# Patient Record
Sex: Male | Born: 1975 | Race: White | Hispanic: No | Marital: Single | Smoking: Current every day smoker
Health system: Southern US, Community
[De-identification: ages and names within clinical notes are randomized; demographics above are authoritative.]

## PROBLEM LIST (undated history)

## (undated) DIAGNOSIS — B370 Candidal stomatitis: Secondary | ICD-10-CM

## (undated) DIAGNOSIS — J189 Pneumonia, unspecified organism: Secondary | ICD-10-CM

## (undated) DIAGNOSIS — Z21 Asymptomatic human immunodeficiency virus [HIV] infection status: Secondary | ICD-10-CM

## (undated) DIAGNOSIS — B029 Zoster without complications: Secondary | ICD-10-CM

## (undated) DIAGNOSIS — B977 Papillomavirus as the cause of diseases classified elsewhere: Secondary | ICD-10-CM

## (undated) DIAGNOSIS — B20 Human immunodeficiency virus [HIV] disease: Secondary | ICD-10-CM

## (undated) HISTORY — DX: Candidal stomatitis: B37.0

## (undated) HISTORY — DX: Papillomavirus as the cause of diseases classified elsewhere: B97.7

## (undated) HISTORY — DX: Zoster without complications: B02.9

## (undated) HISTORY — PX: OTHER SURGICAL HISTORY: SHX169

## (undated) HISTORY — PX: NO PAST SURGERIES: SHX2092

---

## 2004-11-25 DIAGNOSIS — B20 Human immunodeficiency virus [HIV] disease: Secondary | ICD-10-CM

## 2005-07-16 ENCOUNTER — Ambulatory Visit: Payer: Self-pay | Admitting: Infectious Diseases

## 2005-07-16 ENCOUNTER — Ambulatory Visit (HOSPITAL_COMMUNITY): Admission: RE | Admit: 2005-07-16 | Discharge: 2005-07-16 | Payer: Self-pay | Admitting: Infectious Diseases

## 2005-08-06 ENCOUNTER — Ambulatory Visit: Payer: Self-pay | Admitting: Infectious Diseases

## 2005-12-09 ENCOUNTER — Encounter (INDEPENDENT_AMBULATORY_CARE_PROVIDER_SITE_OTHER): Payer: Self-pay | Admitting: *Deleted

## 2005-12-09 ENCOUNTER — Ambulatory Visit: Payer: Self-pay | Admitting: Infectious Diseases

## 2006-02-03 ENCOUNTER — Ambulatory Visit: Payer: Self-pay | Admitting: Infectious Diseases

## 2006-02-09 ENCOUNTER — Ambulatory Visit: Payer: Self-pay | Admitting: Infectious Diseases

## 2006-03-24 ENCOUNTER — Ambulatory Visit: Payer: Self-pay | Admitting: Infectious Diseases

## 2006-04-05 ENCOUNTER — Ambulatory Visit: Payer: Self-pay | Admitting: Infectious Diseases

## 2006-04-08 ENCOUNTER — Ambulatory Visit: Payer: Self-pay | Admitting: Internal Medicine

## 2006-04-08 ENCOUNTER — Encounter: Payer: Self-pay | Admitting: Infectious Diseases

## 2006-04-18 ENCOUNTER — Ambulatory Visit: Payer: Self-pay | Admitting: Infectious Diseases

## 2006-05-19 ENCOUNTER — Ambulatory Visit: Payer: Self-pay | Admitting: Infectious Diseases

## 2006-05-19 ENCOUNTER — Encounter (INDEPENDENT_AMBULATORY_CARE_PROVIDER_SITE_OTHER): Payer: Self-pay | Admitting: *Deleted

## 2006-05-19 LAB — CONVERTED CEMR LAB
CD4 Count: 560 uL
HIV 1 RNA Quant: 171 {copies}/mL

## 2006-06-16 ENCOUNTER — Encounter: Payer: Self-pay | Admitting: Infectious Diseases

## 2006-06-16 ENCOUNTER — Ambulatory Visit: Payer: Self-pay | Admitting: Infectious Diseases

## 2006-08-11 ENCOUNTER — Encounter (INDEPENDENT_AMBULATORY_CARE_PROVIDER_SITE_OTHER): Payer: Self-pay | Admitting: *Deleted

## 2006-08-11 ENCOUNTER — Ambulatory Visit: Payer: Self-pay | Admitting: Infectious Diseases

## 2006-08-11 LAB — CONVERTED CEMR LAB
ALT: 28 U/L
AST: 19 U/L
Albumin: 4.3 g/dL
Alkaline Phosphatase: 73 U/L
Bilirubin, Direct: 0.1 mg/dL
Indirect Bilirubin: 0.2 mg/dL
Total Bilirubin: 0.3 mg/dL
Total Protein: 6.7 g/dL

## 2006-10-05 ENCOUNTER — Ambulatory Visit: Payer: Self-pay | Admitting: Infectious Diseases

## 2006-10-05 ENCOUNTER — Encounter (INDEPENDENT_AMBULATORY_CARE_PROVIDER_SITE_OTHER): Payer: Self-pay | Admitting: *Deleted

## 2006-10-05 LAB — CONVERTED CEMR LAB
ALT: 18 units/L (ref 0–53)
AST: 14 units/L (ref 0–37)
Albumin: 4.9 g/dL (ref 3.5–5.2)
Alkaline Phosphatase: 62 units/L (ref 39–117)
Bilirubin Urine: NEGATIVE
Calcium: 9.1 mg/dL (ref 8.4–10.5)
Creatinine, Ser: 0.91 mg/dL (ref 0.40–1.50)
HDL: 36 mg/dL — ABNORMAL LOW (ref >39)
HIV 1 RNA Quant: 49 copies/mL
Hemoglobin, Urine: NEGATIVE
Ketones, ur: NEGATIVE mg/dL
Protein, ur: NEGATIVE mg/dL
Total Protein: 6.6 g/dL (ref 6.0–8.3)
Triglycerides: 87 mg/dL (ref <150)
Urobilinogen, UA: 0.2 (ref 0.0–1.0)

## 2006-12-19 ENCOUNTER — Encounter (INDEPENDENT_AMBULATORY_CARE_PROVIDER_SITE_OTHER): Payer: Self-pay | Admitting: *Deleted

## 2006-12-19 LAB — CONVERTED CEMR LAB

## 2006-12-26 ENCOUNTER — Ambulatory Visit: Payer: Self-pay | Admitting: Infectious Diseases

## 2006-12-26 DIAGNOSIS — Z8619 Personal history of other infectious and parasitic diseases: Secondary | ICD-10-CM

## 2006-12-26 DIAGNOSIS — B977 Papillomavirus as the cause of diseases classified elsewhere: Secondary | ICD-10-CM | POA: Insufficient documentation

## 2006-12-26 LAB — CONVERTED CEMR LAB
ALT: 23 units/L (ref 0–53)
HIV 1 RNA Quant: 49 copies/mL
Indirect Bilirubin: 0.5 mg/dL (ref 0.0–0.9)
Total Protein: 7.1 g/dL (ref 6.0–8.3)

## 2007-01-01 ENCOUNTER — Encounter (INDEPENDENT_AMBULATORY_CARE_PROVIDER_SITE_OTHER): Payer: Self-pay | Admitting: *Deleted

## 2007-01-24 ENCOUNTER — Ambulatory Visit: Payer: Self-pay | Admitting: Infectious Diseases

## 2007-01-24 DIAGNOSIS — F17200 Nicotine dependence, unspecified, uncomplicated: Secondary | ICD-10-CM | POA: Insufficient documentation

## 2007-03-23 ENCOUNTER — Encounter (INDEPENDENT_AMBULATORY_CARE_PROVIDER_SITE_OTHER): Payer: Self-pay | Admitting: *Deleted

## 2007-03-23 ENCOUNTER — Ambulatory Visit: Payer: Self-pay | Admitting: Infectious Diseases

## 2007-03-23 LAB — CONVERTED CEMR LAB
Bilirubin, Direct: 0.1 mg/dL (ref 0.0–0.3)
CD4 Count: 1126 microliters
CO2: 28 meq/L (ref 19–32)
Calcium: 9.6 mg/dL (ref 8.4–10.5)
Glucose, Bld: 78 mg/dL (ref 70–99)
LDL Cholesterol: 85 mg/dL (ref 0–99)
Leukocytes, UA: NEGATIVE
Nitrite: NEGATIVE
Phosphorus: 4.2 mg/dL (ref 2.3–4.6)
Potassium: 4.2 meq/L (ref 3.5–5.3)
Sodium: 142 meq/L (ref 135–145)
Specific Gravity, Urine: 1.022 (ref 1.005–1.03)
Total Bilirubin: 0.3 mg/dL (ref 0.3–1.2)
Total CHOL/HDL Ratio: 3.7
VLDL: 29 mg/dL (ref 0–40)
pH: 6 (ref 5.0–8.0)

## 2007-06-06 ENCOUNTER — Ambulatory Visit: Payer: Self-pay | Admitting: Infectious Diseases

## 2007-06-06 LAB — CONVERTED CEMR LAB
Albumin: 4.8 g/dL (ref 3.5–5.2)
Total Bilirubin: 0.7 mg/dL (ref 0.3–1.2)
Total Protein: 7.4 g/dL (ref 6.0–8.3)

## 2007-06-08 ENCOUNTER — Encounter: Payer: Self-pay | Admitting: Infectious Diseases

## 2007-06-08 LAB — CONVERTED CEMR LAB: HIV 1 RNA Quant: 49 copies/mL

## 2007-08-28 ENCOUNTER — Ambulatory Visit: Payer: Self-pay | Admitting: Hospitalist

## 2007-08-28 DIAGNOSIS — R05 Cough: Secondary | ICD-10-CM

## 2007-08-28 DIAGNOSIS — D409 Neoplasm of uncertain behavior of male genital organ, unspecified: Secondary | ICD-10-CM

## 2007-08-30 ENCOUNTER — Ambulatory Visit: Payer: Self-pay | Admitting: Infectious Diseases

## 2007-08-30 LAB — CONVERTED CEMR LAB
ALT: 33 units/L (ref 0–53)
AST: 26 units/L (ref 0–37)
BUN: 17 mg/dL (ref 6–23)
Bilirubin Urine: NEGATIVE
CD4 Count: 1121 microliters
Calcium: 9.6 mg/dL (ref 8.4–10.5)
Cholesterol: 144 mg/dL (ref 0–200)
Hemoglobin, Urine: NEGATIVE
Indirect Bilirubin: 0.3 mg/dL (ref 0.0–0.9)
Ketones, ur: NEGATIVE mg/dL
Nitrite: NEGATIVE
Potassium: 4.5 meq/L (ref 3.5–5.3)
Protein, ur: NEGATIVE mg/dL
Sodium: 141 meq/L (ref 135–145)
Total CHOL/HDL Ratio: 4.5
Total Protein: 7.3 g/dL (ref 6.0–8.3)
Triglycerides: 175 mg/dL — ABNORMAL HIGH (ref <150)
Urine Glucose: NEGATIVE mg/dL
Urobilinogen, UA: 1 (ref 0.0–1.0)
VLDL: 35 mg/dL (ref 0–40)
pH: 7 (ref 5.0–8.0)

## 2007-09-26 ENCOUNTER — Ambulatory Visit: Payer: Self-pay | Admitting: Hospitalist

## 2007-09-26 DIAGNOSIS — R21 Rash and other nonspecific skin eruption: Secondary | ICD-10-CM | POA: Insufficient documentation

## 2007-09-26 LAB — CONVERTED CEMR LAB
Basophils Absolute: 0 10*3/uL (ref 0.0–0.1)
CMV IgM: <0.9 (ref <0.90)
Chlamydia, Swab/Urine, PCR: NEGATIVE
GC Probe Amp, Urine: NEGATIVE
Lymphocytes Relative: 44 % (ref 12–46)
Neutro Abs: 1.9 10*3/uL (ref 1.7–7.7)
Neutrophils Relative %: 44 % (ref 43–77)
Platelets: 189 10*3/uL (ref 150–400)
RDW: 13.1 % (ref 11.5–15.5)
Sed Rate: 2 mm/hr (ref 0–16)

## 2007-10-09 ENCOUNTER — Ambulatory Visit: Payer: Self-pay | Admitting: Infectious Diseases

## 2007-10-24 ENCOUNTER — Encounter: Payer: Self-pay | Admitting: Infectious Diseases

## 2007-10-24 ENCOUNTER — Encounter (INDEPENDENT_AMBULATORY_CARE_PROVIDER_SITE_OTHER): Payer: Self-pay | Admitting: *Deleted

## 2007-12-01 ENCOUNTER — Ambulatory Visit: Payer: Self-pay | Admitting: Infectious Diseases

## 2007-12-01 LAB — CONVERTED CEMR LAB
ALT: 18 units/L (ref 0–53)
Alkaline Phosphatase: 86 units/L (ref 39–117)
Indirect Bilirubin: 0.2 mg/dL (ref 0.0–0.9)
Total Protein: 6.9 g/dL (ref 6.0–8.3)

## 2008-02-22 ENCOUNTER — Ambulatory Visit: Payer: Self-pay | Admitting: Infectious Diseases

## 2008-02-22 LAB — CONVERTED CEMR LAB
ALT: 17 units/L (ref 0–53)
AST: 15 units/L (ref 0–37)
Bilirubin, Direct: 0.1 mg/dL (ref 0.0–0.3)
Calcium: 9 mg/dL (ref 8.4–10.5)
Cholesterol: 123 mg/dL (ref 0–200)
Glucose, Bld: 97 mg/dL (ref 70–99)
Hemoglobin, Urine: NEGATIVE
Ketones, ur: NEGATIVE mg/dL
Nitrite: NEGATIVE
Phosphorus: 4 mg/dL (ref 2.3–4.6)
Potassium: 4.5 meq/L (ref 3.5–5.3)
Protein, ur: NEGATIVE mg/dL
Sodium: 141 meq/L (ref 135–145)
Total CHOL/HDL Ratio: 3.4
Total Protein: 7.3 g/dL (ref 6.0–8.3)
Triglycerides: 62 mg/dL (ref <150)
Urine Glucose: NEGATIVE mg/dL
VLDL: 12 mg/dL (ref 0–40)
pH: 6 (ref 5.0–8.0)

## 2008-05-06 ENCOUNTER — Encounter: Payer: Self-pay | Admitting: Infectious Diseases

## 2008-05-14 ENCOUNTER — Ambulatory Visit: Payer: Self-pay | Admitting: Infectious Diseases

## 2008-05-14 LAB — CONVERTED CEMR LAB
BUN: 16 mg/dL (ref 6–23)
Bilirubin, Direct: 0.1 mg/dL (ref 0.0–0.3)
CD4 Count: 1206 microliters
CO2: 24 meq/L (ref 19–32)
Glucose, Bld: 92 mg/dL (ref 70–99)
HIV 1 RNA Quant: 49 copies/mL
Indirect Bilirubin: 0.2 mg/dL (ref 0.0–0.9)
Leukocytes, UA: NEGATIVE
Nitrite: NEGATIVE
Phosphorus: 4.3 mg/dL (ref 2.3–4.6)
Potassium: 4.3 meq/L (ref 3.5–5.3)
Sodium: 139 meq/L (ref 135–145)
Specific Gravity, Urine: 1.024 (ref 1.005–1.03)
Total Bilirubin: 0.3 mg/dL (ref 0.3–1.2)
Total CHOL/HDL Ratio: 4.2
Urobilinogen, UA: 0.2 (ref 0.0–1.0)
VLDL: 35 mg/dL (ref 0–40)

## 2008-05-30 ENCOUNTER — Encounter (INDEPENDENT_AMBULATORY_CARE_PROVIDER_SITE_OTHER): Payer: Self-pay | Admitting: *Deleted

## 2008-06-05 ENCOUNTER — Ambulatory Visit: Payer: Self-pay | Admitting: Internal Medicine

## 2008-06-14 ENCOUNTER — Encounter (INDEPENDENT_AMBULATORY_CARE_PROVIDER_SITE_OTHER): Payer: Self-pay | Admitting: *Deleted

## 2008-08-19 ENCOUNTER — Encounter: Payer: Self-pay | Admitting: Internal Medicine

## 2008-08-19 ENCOUNTER — Ambulatory Visit: Payer: Self-pay | Admitting: Infectious Diseases

## 2008-08-19 LAB — CONVERTED CEMR LAB
ALT: 64 units/L — ABNORMAL HIGH (ref 0–53)
Alkaline Phosphatase: 105 units/L (ref 39–117)
Basophils Absolute: 0 10*3/uL (ref 0.0–0.1)
CO2: 24 meq/L (ref 19–32)
Eosinophils Relative: 1 % (ref 0–5)
HCT: 46.2 % (ref 39.0–52.0)
HIV 1 RNA Quant: 188 copies/mL — ABNORMAL HIGH (ref <50)
Lymphocytes Relative: 27 % (ref 12–46)
Neutro Abs: 3.7 10*3/uL (ref 1.7–7.7)
Neutrophils Relative %: 61 % (ref 43–77)
Platelets: 194 10*3/uL (ref 150–400)
RDW: 13.3 % (ref 11.5–15.5)
Sodium: 142 meq/L (ref 135–145)
Total Bilirubin: 0.4 mg/dL (ref 0.3–1.2)
Total Protein: 7 g/dL (ref 6.0–8.3)

## 2008-09-05 ENCOUNTER — Ambulatory Visit: Payer: Self-pay | Admitting: Infectious Diseases

## 2009-01-15 ENCOUNTER — Encounter (INDEPENDENT_AMBULATORY_CARE_PROVIDER_SITE_OTHER): Payer: Self-pay | Admitting: *Deleted

## 2009-01-27 ENCOUNTER — Ambulatory Visit: Payer: Self-pay | Admitting: Infectious Diseases

## 2009-01-27 LAB — CONVERTED CEMR LAB
BUN: 11 mg/dL (ref 6–23)
CO2: 27 meq/L (ref 19–32)
Calcium: 9.2 mg/dL (ref 8.4–10.5)
Chloride: 105 meq/L (ref 96–112)
Creatinine, Ser: 0.92 mg/dL (ref 0.40–1.50)
GFR calc Af Amer: >60 mL/min (ref >60)
HIV 1 RNA Quant: <48 copies/mL (ref <48)
Lymphs Abs: 2.4 10*3/uL (ref 0.7–4.0)
Monocytes Relative: 9 % (ref 3–12)
Neutro Abs: 3.8 10*3/uL (ref 1.7–7.7)
Neutrophils Relative %: 55 % (ref 43–77)
RBC: 5.02 M/uL (ref 4.22–5.81)
WBC: 6.8 10*3/uL (ref 4.0–10.5)

## 2009-02-13 ENCOUNTER — Ambulatory Visit: Payer: Self-pay | Admitting: Infectious Diseases

## 2009-06-12 ENCOUNTER — Ambulatory Visit: Payer: Self-pay | Admitting: Infectious Diseases

## 2009-06-12 LAB — CONVERTED CEMR LAB
ALT: 29 units/L (ref 0–53)
AST: 19 units/L (ref 0–37)
CO2: 23 meq/L (ref 19–32)
Creatinine, Ser: 1 mg/dL (ref 0.40–1.50)
Eosinophils Absolute: 0.1 10*3/uL (ref 0.0–0.7)
Eosinophils Relative: 1 % (ref 0–5)
HCT: 45.7 % (ref 39.0–52.0)
HDL: 33 mg/dL — ABNORMAL LOW (ref >39)
HIV 1 RNA Quant: <48 copies/mL (ref <48)
HIV-1 RNA Quant, Log: <1.68 (ref <1.68)
Hemoglobin: 15.7 g/dL (ref 13.0–17.0)
LDL Cholesterol: 78 mg/dL (ref 0–99)
Lymphs Abs: 2.7 10*3/uL (ref 0.7–4.0)
MCV: 92.9 fL (ref >=78.0)
Monocytes Absolute: 0.6 10*3/uL (ref 0.1–1.0)
Monocytes Relative: 10 % (ref 3–12)
Platelets: 184 10*3/uL (ref 150–400)
RPR Ser Ql: REACTIVE — AB
Total Bilirubin: 0.4 mg/dL (ref 0.3–1.2)
Total CHOL/HDL Ratio: 4.3
VLDL: 30 mg/dL (ref 0–40)
WBC: 6.4 10*3/uL (ref 4.0–10.5)

## 2009-06-13 ENCOUNTER — Ambulatory Visit: Payer: Self-pay | Admitting: Infectious Diseases

## 2009-06-13 ENCOUNTER — Encounter (INDEPENDENT_AMBULATORY_CARE_PROVIDER_SITE_OTHER): Payer: Self-pay | Admitting: *Deleted

## 2010-01-16 ENCOUNTER — Telehealth (INDEPENDENT_AMBULATORY_CARE_PROVIDER_SITE_OTHER): Payer: Self-pay | Admitting: *Deleted

## 2010-03-24 ENCOUNTER — Encounter (INDEPENDENT_AMBULATORY_CARE_PROVIDER_SITE_OTHER): Payer: Self-pay | Admitting: *Deleted

## 2010-04-06 ENCOUNTER — Telehealth (INDEPENDENT_AMBULATORY_CARE_PROVIDER_SITE_OTHER): Payer: Self-pay | Admitting: *Deleted

## 2010-04-15 ENCOUNTER — Ambulatory Visit: Payer: Self-pay | Admitting: Infectious Diseases

## 2010-04-15 LAB — CONVERTED CEMR LAB
ALT: 28 units/L (ref 0–53)
AST: 18 units/L (ref 0–37)
BUN: 12 mg/dL (ref 6–23)
Basophils Relative: 0 % (ref 0–1)
Calcium: 9.1 mg/dL (ref 8.4–10.5)
Creatinine, Ser: 1.04 mg/dL (ref 0.40–1.50)
Eosinophils Absolute: 0.1 10*3/uL (ref 0.0–0.7)
Eosinophils Relative: 1 % (ref 0–5)
HCT: 46.6 % (ref 39.0–52.0)
HIV 1 RNA Quant: <48 copies/mL (ref <48)
MCHC: 34.1 g/dL (ref 30.0–36.0)
MCV: 95.1 fL (ref 78.0–100.0)
Neutrophils Relative %: 56 % (ref 43–77)
Platelets: 166 10*3/uL (ref 150–400)
RDW: 13.4 % (ref 11.5–15.5)
Total Bilirubin: 0.3 mg/dL (ref 0.3–1.2)

## 2010-09-16 ENCOUNTER — Encounter (INDEPENDENT_AMBULATORY_CARE_PROVIDER_SITE_OTHER): Payer: Self-pay | Admitting: *Deleted

## 2010-09-21 ENCOUNTER — Encounter: Payer: Self-pay | Admitting: Infectious Diseases

## 2010-10-21 ENCOUNTER — Ambulatory Visit: Payer: Self-pay | Admitting: Infectious Diseases

## 2010-11-05 ENCOUNTER — Ambulatory Visit: Admit: 2010-11-05 | Payer: Self-pay | Admitting: Infectious Diseases

## 2010-11-05 ENCOUNTER — Encounter (INDEPENDENT_AMBULATORY_CARE_PROVIDER_SITE_OTHER): Payer: Self-pay | Admitting: *Deleted

## 2010-11-24 NOTE — Miscellaneous (Addendum)
Summary: labs  Clinical Lists Changes  Orders: Added new Test order of T-CBC w/Diff [phone]) - Signed Added new Test order of T-CD4SP [facility]) (CD4SP) - Signed Added new Test order of T-Comprehensive Metabolic Panel [phone]) - Signed Added new Test order of T-Hepatitis B Surface Antibody [phone]) - Signed Added new Test order of T-HIV Viral Load [phone]) - Signed Added new Test order of T-RPR (Syphilis) [phone]) - Signed Added new Test order of T-Lipid Profile ([id number]) - Signed       Process Orders Check Orders Results:     [facility]: ABN not required for this insurance Order queued for requisitioning for [facility]: September 21, 2010 2:00 PM  Tests Sent for requisitioning (September 21, 2010 2:00 PM):     10/21/2010: [facility] -- T-CBC w/Diff [[id number]] (signed)     10/21/2010: [facility] -- T-Comprehensive Metabolic Panel [[id number]] (signed)     10/21/2010: [facility] -- T-Hepatitis B Surface Antibody [[id number]] (signed)     10/21/2010: [facility] -- T-HIV Viral Load [phone] (signed)     10/21/2010: [facility] -- T-RPR (Syphilis) [phone] (signed)     10/21/2010: [facility] -- T-Lipid Profile [phone] (signed)

## 2010-11-24 NOTE — Progress Notes (Signed)
Summary: New script/ patient approved for NCADAP  Phone Note Call from Patient   Caller: Patient Reason for Call: Refill Medication Summary of Call: Patient left message on voicemail that he has been approved for NCADAP and needs his medication called [facility] pharmacy. Please contact patient once this has been done. Initial call taken by: Paulo Fruit  BS,CPht II,MPH,  April 06, 2010 10:48 AM    Prescriptions: ATRIPLA 600-200-300 MG  TABS (EFAVIRENZ-EMTRICITAB-TENOFOVIR) Take 1 tablet by mouth at bedtime  #30 x 11   Entered by:   Paulo Fruit  BS,CPht II,MPH   Authorized by:   Lina Sayre MD   Signed by:   Paulo Fruit  BS,CPht II,MPH on 04/06/2010   Method used:   Electronically to        [facility] [id number]* (retail)       [address]       [place], [place]  [address]       Ph: [phone]       Fax:    RxID:   [id number]  Paulo Fruit  BS,CPht II,MPH  April 06, 2010 10:49 AM

## 2010-11-24 NOTE — Progress Notes (Signed)
Summary: Patient calling to renew for aDAP  Phone Note Call from Patient   Caller: Patient Details for Reason: to renew for ADAP Summary of Call: Patient called to see when he can come and renew for ADAP.  He was told that the next available to will be be in April once the new clinic opens.  He was very concerned about the deadline in which is 01/22/2010.  I was unable to accomadate him at this moment but reassured him that he needs to call the first week in April to get that taken care of. Initial call taken by: Paulo Fruit  BS,CPht II,MPH,  January 16, 2010 3:51 PM     Appended Document: Patient calling to renew for aDAP Patient came in today to complete ADAP reenrollment application

## 2010-11-24 NOTE — Miscellaneous (Signed)
Summary: RW FINANCIAL UPDATE   Clinical Lists Changes

## 2010-11-24 NOTE — Miscellaneous (Signed)
Summary: clinical update/ryan white NCADAP apprv til 01/23/11  Clinical Lists Changes  Observations: Added new observation of AIDSDAP: Yes 2011 (03/24/2010 14:48)

## 2010-11-24 NOTE — Miscellaneous (Signed)
Summary: RW FINANCIAL UPDATE  Clinical Lists Changes  Observations: Added new observation of PCTFPL: 94.62  (09/16/2010 16:23) Added new observation of HOUSEINCOME: 96295  (09/16/2010 16:23) Added new observation of FINASSESSDT: 08/14/2010  (09/16/2010 16:23)

## 2010-11-26 NOTE — Assessment & Plan Note (Signed)
Summary: updated RW form

## 2010-12-17 ENCOUNTER — Other Ambulatory Visit (INDEPENDENT_AMBULATORY_CARE_PROVIDER_SITE_OTHER): Payer: Self-pay

## 2010-12-17 ENCOUNTER — Other Ambulatory Visit: Payer: Self-pay | Admitting: Infectious Diseases

## 2010-12-17 ENCOUNTER — Encounter: Payer: Self-pay | Admitting: Infectious Diseases

## 2010-12-17 DIAGNOSIS — B20 Human immunodeficiency virus [HIV] disease: Secondary | ICD-10-CM

## 2010-12-17 LAB — CONVERTED CEMR LAB
HIV 1 RNA Quant: <20 copies/mL (ref <20)
HIV-1 RNA Quant, Log: <1.3 (ref <1.30)

## 2010-12-18 LAB — T-HELPER CELL (CD4) - (RCID CLINIC ONLY)
CD4 % Helper T Cell: 38 % (ref 33–55)
CD4 T Cell Abs: 910 uL (ref 400–2700)

## 2010-12-23 LAB — CONVERTED CEMR LAB
ALT: 26 units/L (ref 0–53)
AST: 18 units/L (ref 0–37)
Basophils Absolute: 0 10*3/uL (ref 0.0–0.1)
Basophils Relative: 0 % (ref 0–1)
CO2: 24 meq/L (ref 19–32)
Chloride: 106 meq/L (ref 96–112)
Cholesterol: 147 mg/dL (ref 0–200)
Creatinine, Ser: 0.97 mg/dL (ref 0.40–1.50)
Eosinophils Absolute: 0 10*3/uL (ref 0.0–0.7)
Eosinophils Relative: 0 % (ref 0–5)
HCT: 46.4 % (ref 39.0–52.0)
MCV: 93.9 fL (ref 78.0–100.0)
Neutrophils Relative %: 51 % (ref 43–77)
Platelets: 167 10*3/uL (ref 150–400)
RDW: 12.9 % (ref 11.5–15.5)
RPR Ser Ql: REACTIVE — AB
RPR Titer: 1:4 {titer}
Sodium: 139 meq/L (ref 135–145)
T pallidum Antibodies (TP-PA): >8 — ABNORMAL HIGH (ref <0.90)
Total Bilirubin: 0.5 mg/dL (ref 0.3–1.2)
Total Protein: 6.6 g/dL (ref 6.0–8.3)
Triglycerides: 98 mg/dL (ref <150)
VLDL: 20 mg/dL (ref 0–40)
WBC: 6.1 10*3/uL (ref 4.0–10.5)

## 2010-12-31 ENCOUNTER — Encounter: Payer: Self-pay | Admitting: Infectious Diseases

## 2010-12-31 ENCOUNTER — Ambulatory Visit (INDEPENDENT_AMBULATORY_CARE_PROVIDER_SITE_OTHER): Payer: Self-pay | Admitting: Infectious Diseases

## 2010-12-31 DIAGNOSIS — B20 Human immunodeficiency virus [HIV] disease: Secondary | ICD-10-CM

## 2010-12-31 DIAGNOSIS — Z23 Encounter for immunization: Secondary | ICD-10-CM

## 2011-01-05 ENCOUNTER — Encounter (INDEPENDENT_AMBULATORY_CARE_PROVIDER_SITE_OTHER): Payer: Self-pay | Admitting: *Deleted

## 2011-01-05 NOTE — Miscellaneous (Signed)
  Clinical Lists Changes  Orders: Added new Test order of T- * Misc. Laboratory test [phone]) - Signed Added new Test order of T-Hepatitis B Surface Antigen [id number]) - Signed     Process Orders Check Orders Results:     [facility]: ABN not required for this insurance Tests Sent for requisitioning (December 31, 2010 11:11 AM):     12/31/2010: [facility] -- T- * Misc. Laboratory test [[id number]] (signed)     12/31/2010: [facility] -- T-Hepatitis B Surface Antigen [[id number]] (signed)

## 2011-01-05 NOTE — Assessment & Plan Note (Signed)
Summary: [facility] FROM 1/12 F/U   Vital Signs:  Patient profile:   35 year old male Height:      70 inches (177.80 cm) Weight:      156.8 pounds (71.27 kg) BMI:     22.58 Temp:     98.2 degrees F (36.78 degrees C) oral Pulse rate:   90 / minute BP sitting:   115 / 72  (right arm)  Vitals Entered By: Wendall Mola CMA [facility] Dull) (December 31, 2010 10:30 AM) CC: follow-up visit, lab results Is Patient Diabetic? No Pain Assessment Patient in pain? no      Nutritional Status BMI of 19 -24 = normal Nutritional Status Detail appetite "normal"  Have you ever been in a relationship where you felt threatened, hurt or afraid?No   Does patient need assistance? Functional Status Self care Ambulation Normal Comments pt. missed a couple doses of Atripla since last visit   CC:  follow-up visit and lab results.  History of Present Illness: Rickey Jones is doing welll w/o complaint. 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 We will see if he remains HBV infected as well and get quant HBV DNA by PCR. He will continue Atripla and has had excellent reaponse with undectable HIV and CD4 940. Will f/u in 5-6 months and continuing Atripla..  Preventive Screening-Counseling & Management  Alcohol-Tobacco     Alcohol drinks/day: occasional     Alcohol type: beer     Smoking Status: current     Smoking Cessation Counseling: yes     Smoke Cessation Stage: contemplative     Packs/Day: 1.0     Year Started: AT THE AGE OF 21  Caffeine-Diet-Exercise     Caffeine use/day: coffee and soda     Does Patient Exercise: no  Hep-HIV-STD-Contraception     HIV Risk: no risk noted     HIV Risk Counseling: 07/27/2005  Safety-Violence-Falls     Seat Belt Use: yes      Sexual History:  currently monogamous.        Drug Use:  never.    Comments: pt. declined condoms  Allergies: 1)  ! Cephalexin  Physical Exam  General:   Well-developed,well-nourished,in no acute distress; alert,appropriate and cooperative throughout examination Mouth:  Oral mucosa and oropharynx without lesions or exudates.  Teeth in good repair. Cervical Nodes:  No lymphadenopathy noted Axillary Nodes:  No palpable lymphadenopathy Inguinal Nodes:  No significant adenopathy   Impression & Recommendations:  Problem # 1:  HIV DISEASE (ICD-042)  doing well and will see again in 5-6 months  Orders: Est. Patient Level IV (16109)  Problem # 2:  HEPATITIS B, HX OF (ICD-V12.09) Will check quant DNA HBV.  Other Orders: Est. Patient age 56-64 [phone])   Orders Added: 1)  Est. Patient age 70-64 [12] 2)  Est. Patient Level IV [phone]   Not Administered:    Influenza Vaccine not given due to: declined      Appended Document: [facility] FROM 1/12 F/U    Clinical Lists Changes  Orders: Added new Service order of Pneumococcal Vaccine (91478) - Signed Added new Service order of Admin 1st Vaccine (29562) - Signed Observations: Added new observation of PNEUMOVAXVIS: 09/29/09 version given December 31, 2010. (12/31/2010 11:26) Added new observation of PNEUMOVAXLOT: 1723AA (12/31/2010 11:26) Added new observation of PNEUMOVAXEXP: 05/20/2012 (12/31/2010 11:26) Added new observation of PNEUMOVAXBY: Wendall Mola CMA ( AAMA) (  12/31/2010 11:26) Added new observation of PNEUMOVAXRTE: IM (12/31/2010 11:26) Added new observation of PNEUMOVAXDOS: 0.5 ml (12/31/2010 11:26) Added new observation of PNEUMOVAXMFR: Merck (12/31/2010 11:26) Added new observation of PNEUMOVAXSIT: right deltoid (12/31/2010 11:26) Added new observation of PNEUMOVAX: Pneumovax (12/31/2010 11:26)       Immunizations Administered:  Pneumonia Vaccine:    Vaccine Type: Pneumovax    Site: right deltoid    Mfr: Merck    Dose: 0.5 ml    Route: IM    Given by: Wendall Mola CMA ( AAMA)    Exp. Date: 05/20/2012    Lot #: [id number]    VIS given: 09/29/09  version given December 31, 2010.

## 2011-01-05 NOTE — Miscellaneous (Signed)
  Clinical Lists Changes  Orders: Added new Test order of T-CBC w/Diff [phone]) - Signed Added new Test order of T-CD4SP [facility]) (CD4SP) - Signed Added new Test order of T-Comprehensive Metabolic Panel [phone]) - Signed Added new Test order of T-HIV Viral Load [id number]) - Signed     Process Orders Check Orders Results:     [facility]: ABN not required for this insurance Tests Sent for requisitioning (December 31, 2010 11:14 AM):     06/02/2011: [facility] -- T-CBC w/Diff [[id number]] (signed)     06/02/2011: [facility] -- T-Comprehensive Metabolic Panel [[id number]] (signed)     06/02/2011: [facility] -- T-HIV Viral Load [phone] (signed)

## 2011-01-10 LAB — T-HELPER CELL (CD4) - (RCID CLINIC ONLY)
CD4 % Helper T Cell: 37 % (ref 33–55)
CD4 T Cell Abs: 850 uL (ref 400–2700)

## 2011-01-12 NOTE — Miscellaneous (Signed)
Summary: adap update   Clinical Lists Changes  Observations: Added new observation of AIDSDAP: Pending-approval 2012 (01/05/2011 17:48) Added new observation of PCTFPL: 81.14  (01/05/2011 17:48) Added new observation of HOUSEINCOME: 8788  (01/05/2011 17:48) Added new observation of FINASSESSDT: 01/05/2011  (01/05/2011 17:48)

## 2011-01-14 ENCOUNTER — Encounter (INDEPENDENT_AMBULATORY_CARE_PROVIDER_SITE_OTHER): Payer: Self-pay | Admitting: *Deleted

## 2011-01-21 NOTE — Miscellaneous (Signed)
  Clinical Lists Changes  Observations: Added new observation of AIDSDAP: Yes 2012 (01/14/2011 18:12)

## 2011-01-30 LAB — T-HELPER CELL (CD4) - (RCID CLINIC ONLY)
CD4 % Helper T Cell: 36 % (ref 33–55)
CD4 T Cell Abs: 920 uL (ref 400–2700)

## 2011-04-14 ENCOUNTER — Other Ambulatory Visit: Payer: Self-pay | Admitting: *Deleted

## 2011-04-14 DIAGNOSIS — B20 Human immunodeficiency virus [HIV] disease: Secondary | ICD-10-CM

## 2011-04-14 MED ORDER — EFAVIRENZ-EMTRICITAB-TENOFOVIR 600-200-300 MG PO TABS: 1.0000 | ORAL_TABLET | Freq: Every day | ORAL | Status: DC

## 2011-06-30 ENCOUNTER — Ambulatory Visit: Payer: Self-pay

## 2011-07-26 LAB — T-HELPER CELL (CD4) - (RCID CLINIC ONLY): CD4 T Cell Abs: 620

## 2011-09-23 ENCOUNTER — Encounter: Payer: Self-pay | Admitting: *Deleted

## 2011-10-21 ENCOUNTER — Telehealth: Payer: Self-pay | Admitting: *Deleted

## 2011-10-21 NOTE — Telephone Encounter (Signed)
Left message for patient to call and schedule a follow up appointment

## 2011-12-02 ENCOUNTER — Telehealth: Payer: Self-pay | Admitting: Infectious Diseases

## 2011-12-03 ENCOUNTER — Other Ambulatory Visit: Payer: Self-pay | Admitting: *Deleted

## 2011-12-03 DIAGNOSIS — B20 Human immunodeficiency virus [HIV] disease: Secondary | ICD-10-CM

## 2011-12-03 MED ORDER — EFAVIRENZ-EMTRICITAB-TENOFOVIR 600-200-300 MG PO TABS: 1.0000 | ORAL_TABLET | Freq: Every day | ORAL | Status: DC

## 2012-01-12 ENCOUNTER — Telehealth: Payer: Self-pay

## 2012-01-12 NOTE — Telephone Encounter (Signed)
[facility] called trying to reach patient about his atripla shipment - patient not returning calls - called him as well to remind him to renew adap and left message to call me back as well.

## 2012-01-13 ENCOUNTER — Other Ambulatory Visit: Payer: Self-pay | Admitting: *Deleted

## 2012-01-13 ENCOUNTER — Other Ambulatory Visit (INDEPENDENT_AMBULATORY_CARE_PROVIDER_SITE_OTHER): Payer: Self-pay

## 2012-01-13 DIAGNOSIS — Z79899 Other long term (current) drug therapy: Secondary | ICD-10-CM

## 2012-01-13 DIAGNOSIS — Z113 Encounter for screening for infections with a predominantly sexual mode of transmission: Secondary | ICD-10-CM

## 2012-01-13 DIAGNOSIS — B20 Human immunodeficiency virus [HIV] disease: Secondary | ICD-10-CM

## 2012-01-13 LAB — CBC WITH DIFFERENTIAL/PLATELET
Eosinophils Relative: 1 % (ref 0–5)
HCT: 46.2 % (ref 39.0–52.0)
Hemoglobin: 15.9 g/dL (ref 13.0–17.0)
Lymphocytes Relative: 44 % (ref 12–46)
Lymphs Abs: 2.4 10*3/uL (ref 0.7–4.0)
MCV: 93 fL (ref 78.0–100.0)
Monocytes Absolute: 0.5 10*3/uL (ref 0.1–1.0)
Monocytes Relative: 9 % (ref 3–12)
Neutro Abs: 2.5 10*3/uL (ref 1.7–7.7)
RBC: 4.97 MIL/uL (ref 4.22–5.81)
RDW: 13.1 % (ref 11.5–15.5)
WBC: 5.4 10*3/uL (ref 4.0–10.5)

## 2012-01-13 LAB — COMPLETE METABOLIC PANEL WITH GFR
AST: 20 U/L (ref 0–37)
Albumin: 4.5 g/dL (ref 3.5–5.2)
BUN: 8 mg/dL (ref 6–23)
CO2: 26 mEq/L (ref 19–32)
Calcium: 8.6 mg/dL (ref 8.4–10.5)
Chloride: 105 mEq/L (ref 96–112)
Creat: 0.9 mg/dL (ref 0.50–1.35)
GFR, Est African American: >89 mL/min
Glucose, Bld: 91 mg/dL (ref 70–99)
Potassium: 4.3 mEq/L (ref 3.5–5.3)

## 2012-01-13 LAB — RPR TITER: RPR Titer: 1:4 {titer}

## 2012-01-13 LAB — URINALYSIS, ROUTINE W REFLEX MICROSCOPIC
Glucose, UA: NEGATIVE mg/dL
Leukocytes, UA: NEGATIVE
Nitrite: NEGATIVE
Specific Gravity, Urine: 1.009 (ref 1.005–1.030)
pH: 6 (ref 5.0–8.0)

## 2012-01-13 LAB — LIPID PANEL
Cholesterol: 123 mg/dL (ref 0–200)
Triglycerides: 47 mg/dL (ref <150)
VLDL: 9 mg/dL (ref 0–40)

## 2012-01-13 LAB — RPR: RPR Ser Ql: REACTIVE — AB

## 2012-01-13 MED ORDER — EFAVIRENZ-EMTRICITAB-TENOFOVIR 600-200-300 MG PO TABS: 1.0000 | ORAL_TABLET | Freq: Every day | ORAL | Status: DC

## 2012-01-13 NOTE — Telephone Encounter (Signed)
I left a message asking him to call back & press 1 to make an appt. I told him we may be able to get him in tomorrow. Refilled #30 Atripla

## 2012-01-14 LAB — T.PALLIDUM AB, TOTAL: T pallidum Antibodies (TP-PA): >8 S/CO — ABNORMAL HIGH (ref <0.90)

## 2012-01-14 LAB — T-HELPER CELL (CD4) - (RCID CLINIC ONLY): CD4 % Helper T Cell: 36 % (ref 33–55)

## 2012-01-17 LAB — HIV-1 RNA QUANT-NO REFLEX-BLD: HIV-1 RNA Quant, Log: 1.41 {Log} — ABNORMAL HIGH (ref <1.30)

## 2012-02-01 ENCOUNTER — Ambulatory Visit: Payer: Self-pay | Admitting: Internal Medicine

## 2012-02-01 ENCOUNTER — Telehealth: Payer: Self-pay | Admitting: *Deleted

## 2012-02-01 NOTE — Telephone Encounter (Signed)
Called patient to try and reschedule his missed appt. Not able to get him no the line. Will refer to [facility] counselor as he had a reactive RPR and we need to know if he has been treated.

## 2012-02-09 ENCOUNTER — Encounter: Payer: Self-pay | Admitting: *Deleted

## 2012-02-09 NOTE — Progress Notes (Signed)
BC has spoken with this patient and he has declined bridge counseling services. Patient is aware that he needs to update his Rickey Jones and ADAP, but states that he wants to reschedule his "own appointments" when he is ready. BC did give patient BC's contact information and encouraged patient to call BC in the future if needed.

## 2012-03-10 ENCOUNTER — Telehealth: Payer: Self-pay

## 2012-03-10 NOTE — Telephone Encounter (Signed)
Patient called and wanted to schedule adap and ryan white renewal - he has labs in March 2013, but did not show for Dr follow-up in April - has not been seen by Dr in over a year - advised him to make Dr appt and I would see him same day for RW/ADAP - he said he would call me Monday 5/20 to let me know his schedule.

## 2012-03-10 NOTE — Telephone Encounter (Signed)
Patient called and wanted to schedule adap and ryan white renewal - he has labs in March 2013, but did not show for Dr  follow-up in April with Dr snider. - has not been seen by Dr in over a year - advised him to make Dr appt and I would see him same day for RW/ADAP - he said he would call me Monday 5/20 to let me know his work schedule. He wanted to come in today, but advised could not send without him seeing a Dr first.

## 2012-03-13 ENCOUNTER — Telehealth: Payer: Self-pay | Admitting: *Deleted

## 2012-03-13 NOTE — Telephone Encounter (Signed)
Pt had labs drawn on 01/13/12 and then No Showed for MD appt on 02/01/12.  Pt needing ADAP and Juanell Fairly renewal for continued medication/clinc coverage.  Next MD appt will be June 20 w/ Dr. Drue Second.  RN advised the pt that if/when he runs out of medication it will be OK to be without medication until he comes in for the MD appt.  Pt verbalized understanding.

## 2012-04-13 ENCOUNTER — Encounter: Payer: Self-pay | Admitting: Internal Medicine

## 2012-04-13 ENCOUNTER — Ambulatory Visit (INDEPENDENT_AMBULATORY_CARE_PROVIDER_SITE_OTHER): Payer: Self-pay | Admitting: Internal Medicine

## 2012-04-13 VITALS — BP 110/68 | HR 81 | Temp 98.2°F | Wt 144.0 lb

## 2012-04-13 DIAGNOSIS — B20 Human immunodeficiency virus [HIV] disease: Secondary | ICD-10-CM

## 2012-04-13 LAB — CBC WITH DIFFERENTIAL/PLATELET
Basophils Absolute: 0 10*3/uL (ref 0.0–0.1)
Basophils Relative: 0 % (ref 0–1)
Eosinophils Absolute: 0.1 10*3/uL (ref 0.0–0.7)
Eosinophils Relative: 2 % (ref 0–5)
HCT: 43.8 % (ref 39.0–52.0)
Hemoglobin: 15.1 g/dL (ref 13.0–17.0)
Lymphocytes Relative: 42 % (ref 12–46)
Lymphs Abs: 2.1 10*3/uL (ref 0.7–4.0)
MCH: 32.5 pg (ref 26.0–34.0)
MCHC: 34.5 g/dL (ref 30.0–36.0)
MCV: 94.2 fL (ref 78.0–100.0)
Monocytes Absolute: 0.5 10*3/uL (ref 0.1–1.0)
Monocytes Relative: 10 % (ref 3–12)
Neutro Abs: 2.3 10*3/uL (ref 1.7–7.7)
Neutrophils Relative %: 46 % (ref 43–77)
Platelets: 175 10*3/uL (ref 150–400)
RBC: 4.65 MIL/uL (ref 4.22–5.81)
RDW: 13.3 % (ref 11.5–15.5)
WBC: 5 10*3/uL (ref 4.0–10.5)

## 2012-04-13 LAB — COMPLETE METABOLIC PANEL WITH GFR
ALT: 12 U/L (ref 0–53)
AST: 11 U/L (ref 0–37)
Albumin: 4.4 g/dL (ref 3.5–5.2)
Alkaline Phosphatase: 55 U/L (ref 39–117)
BUN: 8 mg/dL (ref 6–23)
CO2: 29 mEq/L (ref 19–32)
Calcium: 9.3 mg/dL (ref 8.4–10.5)
Chloride: 106 mEq/L (ref 96–112)
Creat: 0.83 mg/dL (ref 0.50–1.35)
GFR, Est African American: >89 mL/min
GFR, Est Non African American: >89 mL/min
Glucose, Bld: 86 mg/dL (ref 70–99)
Potassium: 4.6 mEq/L (ref 3.5–5.3)
Sodium: 142 mEq/L (ref 135–145)
Total Bilirubin: 0.5 mg/dL (ref 0.3–1.2)
Total Protein: 6.2 g/dL (ref 6.0–8.3)

## 2012-04-13 MED ORDER — EFAVIRENZ-EMTRICITAB-TENOFOVIR 600-200-300 MG PO TABS: 1.0000 | ORAL_TABLET | Freq: Every day | ORAL | Status: DC

## 2012-04-14 ENCOUNTER — Ambulatory Visit: Payer: Self-pay

## 2012-04-21 ENCOUNTER — Other Ambulatory Visit: Payer: Self-pay | Admitting: *Deleted

## 2012-04-21 DIAGNOSIS — B20 Human immunodeficiency virus [HIV] disease: Secondary | ICD-10-CM

## 2012-04-21 MED ORDER — EFAVIRENZ-EMTRICITAB-TENOFOVIR 600-200-300 MG PO TABS: 1.0000 | ORAL_TABLET | Freq: Every day | ORAL | Status: DC

## 2012-04-21 NOTE — Telephone Encounter (Signed)
Sent to [facility] in [place], [place], not printed as on 04/13/12.

## 2012-04-24 ENCOUNTER — Other Ambulatory Visit: Payer: Self-pay | Admitting: *Deleted

## 2012-04-24 DIAGNOSIS — B20 Human immunodeficiency virus [HIV] disease: Secondary | ICD-10-CM

## 2012-04-24 MED ORDER — EFAVIRENZ-EMTRICITAB-TENOFOVIR 600-200-300 MG PO TABS: 1.0000 | ORAL_TABLET | Freq: Every day | ORAL | Status: DC

## 2012-06-07 ENCOUNTER — Other Ambulatory Visit: Payer: Self-pay | Admitting: Internal Medicine

## 2012-06-07 DIAGNOSIS — B20 Human immunodeficiency virus [HIV] disease: Secondary | ICD-10-CM

## 2012-06-08 ENCOUNTER — Other Ambulatory Visit (INDEPENDENT_AMBULATORY_CARE_PROVIDER_SITE_OTHER): Payer: Self-pay

## 2012-06-08 DIAGNOSIS — B20 Human immunodeficiency virus [HIV] disease: Secondary | ICD-10-CM

## 2012-06-08 LAB — CBC WITH DIFFERENTIAL/PLATELET
Basophils Absolute: 0 10*3/uL (ref 0.0–0.1)
Basophils Relative: 0 % (ref 0–1)
Lymphocytes Relative: 38 % (ref 12–46)
MCHC: 35.9 g/dL (ref 30.0–36.0)
Monocytes Absolute: 0.5 10*3/uL (ref 0.1–1.0)
Neutro Abs: 2.6 10*3/uL (ref 1.7–7.7)
Neutrophils Relative %: 51 % (ref 43–77)
Platelets: 182 10*3/uL (ref 150–400)
RDW: 12.8 % (ref 11.5–15.5)
WBC: 5 10*3/uL (ref 4.0–10.5)

## 2012-06-08 LAB — COMPREHENSIVE METABOLIC PANEL
ALT: 18 U/L (ref 0–53)
AST: 13 U/L (ref 0–37)
Albumin: 4.3 g/dL (ref 3.5–5.2)
Alkaline Phosphatase: 64 U/L (ref 39–117)
BUN: 8 mg/dL (ref 6–23)
Calcium: 8.9 mg/dL (ref 8.4–10.5)
Chloride: 105 mEq/L (ref 96–112)
Potassium: 4.1 mEq/L (ref 3.5–5.3)

## 2012-06-09 LAB — T-HELPER CELL (CD4) - (RCID CLINIC ONLY)
CD4 % Helper T Cell: 41 % (ref 33–55)
CD4 T Cell Abs: 890 uL (ref 400–2700)

## 2012-06-22 ENCOUNTER — Encounter: Payer: Self-pay | Admitting: Internal Medicine

## 2012-06-22 ENCOUNTER — Ambulatory Visit: Payer: Self-pay

## 2012-06-22 ENCOUNTER — Ambulatory Visit (INDEPENDENT_AMBULATORY_CARE_PROVIDER_SITE_OTHER): Payer: Self-pay | Admitting: Internal Medicine

## 2012-06-22 VITALS — BP 125/77 | HR 92 | Temp 98.3°F | Ht 70.0 in | Wt 147.0 lb

## 2012-06-22 DIAGNOSIS — Z113 Encounter for screening for infections with a predominantly sexual mode of transmission: Secondary | ICD-10-CM

## 2012-06-22 DIAGNOSIS — B20 Human immunodeficiency virus [HIV] disease: Secondary | ICD-10-CM

## 2012-06-23 NOTE — Assessment & Plan Note (Signed)
He continues to do well with Atripla and encouraged continued compliance. He was reminded to use condoms with all sexual activity.  He will return to clinic in 4 months for routine followup.

## 2012-06-23 NOTE — Progress Notes (Signed)
  Subjective:    Patient ID: Rickey Jones, male    DOB: [birth year], 36 y.o.   MRN: [id number]  HPI This patient comes in for routine followup.  He tells me he has been on Atripla for many years and has taken it well. He does though have a period where he had stopped taking it for a few months it restarted again. He denies any missed doses since starting it back again. He's had no side effects and continues to feel well. He has no specific complaints today.   Review of Systems  Constitutional: Negative for fever, appetite change, fatigue and unexpected weight change.  HENT: Negative for sore throat and trouble swallowing.   Respiratory: Negative for cough, shortness of breath and wheezing.   Cardiovascular: Negative for chest pain, palpitations and leg swelling.  Gastrointestinal: Negative for nausea, abdominal pain and diarrhea.  Musculoskeletal: Negative for myalgias, joint swelling and arthralgias.  Skin: Negative for rash.  Neurological: Negative for dizziness and headaches.       Objective:   Physical Exam  Constitutional: He appears well-developed and well-nourished. No distress.  HENT:  Mouth/Throat: Oropharynx is clear and moist. No oropharyngeal exudate.  Neck:       < 0.5 cm left ant cervical node  Cardiovascular: Normal rate, regular rhythm and normal heart sounds.  Exam reveals no gallop and no friction rub.   No murmur heard. Pulmonary/Chest: Effort normal and breath sounds normal. No respiratory distress. He has no wheezes. He has no rales.  Abdominal: Soft. Bowel sounds are normal. He exhibits no distension. There is no tenderness. There is no rebound.          Assessment & Plan:

## 2012-07-24 NOTE — Progress Notes (Signed)
HIV CLINIC VISIT  RFV: follow up visit Subjective:    Patient ID: Rickey Jones, male    DOB: 10-Aug-1976, 36 y.o.   MRN: [id number]  HPI Rickey Jones is a 36yo Male with HIV/HBV CD4 count of 850(36%)/VL 26, who has been on atripla, but stopped 6 wks ago since he ran out of adap coverage. He is here to re-establish paperwork and get back on meds. Current Outpatient Prescriptions on File Prior to Visit  Medication Sig Dispense Refill  . efavirenz-emtrictabine-tenofovir (ATRIPLA) 600-200-300 MG per tablet Take 1 tablet by mouth at bedtime.  30 tablet  6   Active Ambulatory Problems    Diagnosis Date Noted  . 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 PENILE LESION 08/28/2007  . TOBACCO USER 01/24/2007  . RASH AND OTHER NONSPECIFIC SKIN ERUPTION 09/26/2007  . COUGH 08/28/2007  . HEPATITIS B, HX OF 12/26/2006  . HIV DISEASE 11/25/2004   Resolved Ambulatory Problems    Diagnosis Date Noted  . No Resolved Ambulatory Problems   No Additional Past Medical History   History  Substance Use Topics  . Smoking status: Current Every Day Smoker -- 1.0 packs/day for 15 years    Types: Cigarettes  . Smokeless tobacco: Never Used  . Alcohol Use: 0.5 oz/week    1 drink(s) per week     occasional     Review of Systems  10 point ROS is negative.     Objective:   Physical Exam BP 110/68  Pulse 81  Temp 98.2 F (36.8 C) (Oral)  Wt 144 lb (65.318 kg) Physical Exam  Constitutional: He is oriented to person, place, and time. He appears well-developed and well-nourished. No distress.  HENT:  Mouth/Throat: Oropharynx is clear and moist. No oropharyngeal exudate.  Cardiovascular: Normal rate, regular rhythm and normal heart sounds. Exam reveals no gallop and no friction rub.  No murmur heard.  Pulmonary/Chest: Effort normal and breath sounds normal. No respiratory distress. He has no wheezes.  Abdominal: Soft. Bowel sounds are normal. He exhibits no distension. There is no tenderness.  Lymphadenopathy:    He has no cervical adenopathy.  Neurological: He is alert and oriented to person, place, and time.  Skin: Skin is warm and dry. No rash noted. No erythema.  Psychiatric: He has a normal mood and affect. His behavior is normal.      Assessment & Plan:  HIV = will restart back on atripla once his adap coverage comes through. Will check labs today as new baseline. Will have him come back in 2-3 months to recheck his cd 4 count and viral load  hbv = does not have any abd pain/jaundice to suggest HepB flare; will give hep A vaccine today  Health maintenance = will need hep A vac#2 in end Dec; flu shot at next visit  rtc in 2-3 months

## 2012-12-13 ENCOUNTER — Other Ambulatory Visit: Payer: Self-pay | Admitting: *Deleted

## 2012-12-13 ENCOUNTER — Telehealth: Payer: Self-pay | Admitting: *Deleted

## 2012-12-13 MED ORDER — EFAVIRENZ-EMTRICITAB-TENOFOVIR 600-200-300 MG PO TABS: 1.0000 | ORAL_TABLET | Freq: Every day | ORAL | Status: DC

## 2012-12-13 NOTE — Progress Notes (Signed)
Patient needs to make lab, follow up, and ADAP renewal appointments. Andree Coss, RN

## 2012-12-13 NOTE — Telephone Encounter (Signed)
Prescription renewal request came from [facility]. Upon chart review, patient neglected to make return appointments after last visit. Patient given 1 month supply c 0 refills. Patient called and notified of this, asking him to please call our office and make lab, follow up, and ADAP renewal appointments. Andree Coss, RN

## 2013-07-23 ENCOUNTER — Other Ambulatory Visit: Payer: Self-pay

## 2013-07-23 ENCOUNTER — Ambulatory Visit: Payer: Self-pay

## 2013-07-23 ENCOUNTER — Other Ambulatory Visit: Payer: Self-pay | Admitting: *Deleted

## 2013-07-23 DIAGNOSIS — Z113 Encounter for screening for infections with a predominantly sexual mode of transmission: Secondary | ICD-10-CM

## 2013-07-23 DIAGNOSIS — B20 Human immunodeficiency virus [HIV] disease: Secondary | ICD-10-CM

## 2013-07-23 DIAGNOSIS — Z79899 Other long term (current) drug therapy: Secondary | ICD-10-CM

## 2013-07-23 LAB — CBC WITH DIFFERENTIAL/PLATELET
Basophils Relative: 0 % (ref 0–1)
HCT: 45.3 % (ref 39.0–52.0)
Hemoglobin: 15.8 g/dL (ref 13.0–17.0)
Lymphocytes Relative: 31 % (ref 12–46)
MCH: 30.3 pg (ref 26.0–34.0)
MCHC: 34.9 g/dL (ref 30.0–36.0)
Monocytes Absolute: 0.7 10*3/uL (ref 0.1–1.0)
Monocytes Relative: 12 % (ref 3–12)
Neutro Abs: 3.4 10*3/uL (ref 1.7–7.7)
Neutrophils Relative %: 57 % (ref 43–77)
RBC: 5.21 MIL/uL (ref 4.22–5.81)
WBC: 5.9 10*3/uL (ref 4.0–10.5)

## 2013-07-23 LAB — RPR TITER: RPR Titer: 1:4 {titer}

## 2013-07-23 LAB — COMPLETE METABOLIC PANEL WITH GFR
Alkaline Phosphatase: 51 U/L (ref 39–117)
BUN: 11 mg/dL (ref 6–23)
CO2: 30 mEq/L (ref 19–32)
Creat: 0.98 mg/dL (ref 0.50–1.35)
GFR, Est African American: >89 mL/min
GFR, Est Non African American: >89 mL/min
Glucose, Bld: 83 mg/dL (ref 70–99)
Total Bilirubin: 0.5 mg/dL (ref 0.3–1.2)
Total Protein: 7.2 g/dL (ref 6.0–8.3)

## 2013-07-23 LAB — LIPID PANEL
HDL: 29 mg/dL — ABNORMAL LOW (ref >39)
LDL Cholesterol: 84 mg/dL (ref 0–99)

## 2013-07-23 MED ORDER — EFAVIRENZ-EMTRICITAB-TENOFOVIR 600-200-300 MG PO TABS: 1.0000 | ORAL_TABLET | Freq: Every day | ORAL | Status: DC

## 2013-07-24 LAB — HIV-1 RNA QUANT-NO REFLEX-BLD
HIV 1 RNA Quant: 11255 copies/mL — ABNORMAL HIGH (ref <20)
HIV-1 RNA Quant, Log: 4.05 {Log} — ABNORMAL HIGH (ref <1.30)

## 2013-07-24 LAB — T-HELPER CELL (CD4) - (RCID CLINIC ONLY)
CD4 % Helper T Cell: 26 % — ABNORMAL LOW (ref 33–55)
CD4 T Cell Abs: 480 /uL (ref 400–2700)

## 2013-07-24 LAB — T.PALLIDUM AB, TOTAL: T pallidum Antibodies (TP-PA): >8 S/CO — ABNORMAL HIGH (ref <0.90)

## 2013-08-06 ENCOUNTER — Ambulatory Visit (INDEPENDENT_AMBULATORY_CARE_PROVIDER_SITE_OTHER): Payer: Self-pay | Admitting: Internal Medicine

## 2013-08-06 ENCOUNTER — Encounter: Payer: Self-pay | Admitting: Internal Medicine

## 2013-08-06 VITALS — BP 117/76 | HR 93 | Temp 98.5°F | Wt 162.0 lb

## 2013-08-06 DIAGNOSIS — B20 Human immunodeficiency virus [HIV] disease: Secondary | ICD-10-CM

## 2013-08-06 MED ORDER — EMTRICITAB-RILPIVIR-TENOFOV DF 200-25-300 MG PO TABS: 1.0000 | ORAL_TABLET | Freq: Every day | ORAL | Status: DC

## 2013-08-06 NOTE — Progress Notes (Signed)
[facility] HIV CLINIC NOTE  RFV: routine Subjective:    Patient ID: Rickey Jones, male    DOB: [birth year], 37 y.o.   MRN: [id number]  HPI 37yo M with HIV, CD 4 count of 480(28%)/VL 11,255, last seen in June 2013, ran out of HIV meds in March 2014. RPR 1:4 stable. He reports having a sinus infection 3 wks ago that was accompanied with fever, and now improved. Using robitussin DM.  Current Outpatient Prescriptions on File Prior to Visit  Medication Sig Dispense Refill  . efavirenz-emtricitabine-tenofovir (ATRIPLA) 600-200-300 MG per tablet Take 1 tablet by mouth at bedtime.  30 tablet  11   No current facility-administered medications on file prior to visit.   Active Ambulatory Problems    Diagnosis Date Noted  . 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 PENILE LESION 08/28/2007  . TOBACCO USER 01/24/2007  . RASH AND OTHER NONSPECIFIC SKIN ERUPTION 09/26/2007  . COUGH 08/28/2007  . HEPATITIS B, HX OF 12/26/2006  . HIV DISEASE 11/25/2004   Resolved Ambulatory Problems    Diagnosis Date Noted  . No Resolved Ambulatory Problems   No Additional Past Medical History   soc hx: involved, smoke - 1PPD.   Review of Systems  Constitutional: Negative for fever, chills, diaphoresis, activity change, appetite change, fatigue and unexpected weight change.  HENT: Negative for congestion, sore throat, rhinorrhea, sneezing, trouble swallowing and sinus pressure.  Eyes: Negative for photophobia and visual disturbance.  Respiratory: Negative for cough, chest tightness, shortness of breath, wheezing and stridor.  Cardiovascular: Negative for chest pain, palpitations and leg swelling.  Gastrointestinal: Negative for nausea, vomiting, abdominal pain, diarrhea, constipation, blood in stool, abdominal distention and anal bleeding.  Genitourinary: Negative for dysuria, hematuria, flank pain and difficulty urinating.  Musculoskeletal: Negative for myalgias, back pain, joint swelling, arthralgias and gait problem.   Skin: Negative for color change, pallor, rash and wound.  Neurological: Negative for dizziness, tremors, weakness and light-headedness.  Hematological: Negative for adenopathy. Does not bruise/bleed easily.  Psychiatric/Behavioral: Negative for behavioral problems, confusion, sleep disturbance, dysphoric mood, decreased concentration and agitation.       Objective:   Physical Exam BP 117/76  Pulse 93  Temp(Src) 98.5 F (36.9 C) (Oral)  Wt 162 lb (73.483 kg)  BMI 23.24 kg/m2 Physical Exam  Constitutional: He is oriented to person, place, and time. He appears well-developed and well-nourished. No distress.  HENT:  Mouth/Throat: Oropharynx is clear and moist. No oropharyngeal exudate.  Cardiovascular: Normal rate, regular rhythm and normal heart sounds. Exam reveals no gallop and no friction rub.  No murmur heard.  Pulmonary/Chest: Effort normal and breath sounds normal. No respiratory distress. He has no wheezes.  Abdominal: Soft. Bowel sounds are normal. He exhibits no distension. There is no tenderness.  Lymphadenopathy:  He has no cervical adenopathy.  Neurological: He is alert and oriented to person, place, and time.  Skin: Skin is warm and dry. No rash noted. No erythema.  Psychiatric: He has a normal mood and affect. His behavior is normal.       Assessment & Plan:  HIV = awaiting adap approval. Will switch to complera since he suffers from insomnia to see if still is viable regimen for him  Health promotion = refused flu vaccination. and hep A vaccination at next visit. At next visit, will check ua, and ur GC/chlam.  Hep B = he states that he has had HepB although no surface ag or ab +, hep b vl undetectable.may have had infection that cleared. Will  do testing at next visit to investigate need for hep b immunizations  History of syphilis= RPR unchanged. No recent symptoms  Tinea corporis = will try topical antifungal BID x 2-4 wks  Uri =recovering from a cold.  rtc  in 8 wks

## 2013-09-10 ENCOUNTER — Other Ambulatory Visit: Payer: Self-pay

## 2013-09-24 ENCOUNTER — Ambulatory Visit: Payer: Self-pay | Admitting: Internal Medicine

## 2013-09-25 ENCOUNTER — Telehealth: Payer: Self-pay | Admitting: *Deleted

## 2013-09-25 NOTE — Telephone Encounter (Signed)
Called patient to try and reschedule his missed appt and had to leave a message on his voicemail to call the office asap.

## 2014-07-24 ENCOUNTER — Other Ambulatory Visit (INDEPENDENT_AMBULATORY_CARE_PROVIDER_SITE_OTHER): Payer: Self-pay

## 2014-07-24 DIAGNOSIS — Z79899 Other long term (current) drug therapy: Secondary | ICD-10-CM

## 2014-07-24 DIAGNOSIS — Z113 Encounter for screening for infections with a predominantly sexual mode of transmission: Secondary | ICD-10-CM

## 2014-07-24 DIAGNOSIS — B20 Human immunodeficiency virus [HIV] disease: Secondary | ICD-10-CM

## 2014-07-24 LAB — LIPID PANEL
CHOL/HDL RATIO: 4.7 ratio
Cholesterol: 126 mg/dL (ref 0–200)
HDL: 27 mg/dL — AB (ref >39)
LDL CALC: 78 mg/dL (ref 0–99)
Triglycerides: 105 mg/dL (ref <150)
VLDL: 21 mg/dL (ref 0–40)

## 2014-07-24 LAB — COMPLETE METABOLIC PANEL WITH GFR
ALBUMIN: 4.1 g/dL (ref 3.5–5.2)
ALT: 19 U/L (ref 0–53)
AST: 13 U/L (ref 0–37)
Alkaline Phosphatase: 58 U/L (ref 39–117)
BILIRUBIN TOTAL: 0.3 mg/dL (ref 0.2–1.2)
BUN: 13 mg/dL (ref 6–23)
CO2: 27 meq/L (ref 19–32)
Calcium: 9 mg/dL (ref 8.4–10.5)
Chloride: 104 mEq/L (ref 96–112)
Creat: 0.92 mg/dL (ref 0.50–1.35)
GFR, Est African American: >89 mL/min
GFR, Est Non African American: >89 mL/min
Glucose, Bld: 83 mg/dL (ref 70–99)
POTASSIUM: 4.2 meq/L (ref 3.5–5.3)
Sodium: 139 mEq/L (ref 135–145)
Total Protein: 6.8 g/dL (ref 6.0–8.3)

## 2014-07-24 LAB — CBC WITH DIFFERENTIAL/PLATELET
BASOS ABS: 0 10*3/uL (ref 0.0–0.1)
BASOS PCT: 0 % (ref 0–1)
EOS PCT: 1 % (ref 0–5)
Eosinophils Absolute: 0 10*3/uL (ref 0.0–0.7)
HCT: 44.8 % (ref 39.0–52.0)
Hemoglobin: 15.5 g/dL (ref 13.0–17.0)
Lymphocytes Relative: 33 % (ref 12–46)
Lymphs Abs: 1.5 10*3/uL (ref 0.7–4.0)
MCH: 30.2 pg (ref 26.0–34.0)
MCHC: 34.6 g/dL (ref 30.0–36.0)
MCV: 87.3 fL (ref 78.0–100.0)
MONO ABS: 0.5 10*3/uL (ref 0.1–1.0)
Monocytes Relative: 11 % (ref 3–12)
Neutro Abs: 2.5 10*3/uL (ref 1.7–7.7)
Neutrophils Relative %: 55 % (ref 43–77)
Platelets: 148 10*3/uL — ABNORMAL LOW (ref 150–400)
RBC: 5.13 MIL/uL (ref 4.22–5.81)
RDW: 14 % (ref 11.5–15.5)
WBC: 4.6 10*3/uL (ref 4.0–10.5)

## 2014-07-24 LAB — RPR TITER: RPR Titer: 1:4 {titer}

## 2014-07-24 LAB — RPR: RPR: REACTIVE — AB

## 2014-07-25 LAB — FLUORESCENT TREPONEMAL AB(FTA)-IGG-BLD: Fluorescent Treponemal ABS: REACTIVE — AB

## 2014-07-25 LAB — T-HELPER CELL (CD4) - (RCID CLINIC ONLY)
CD4 % Helper T Cell: 29 % — ABNORMAL LOW (ref 33–55)
CD4 T Cell Abs: 490 /uL (ref 400–2700)

## 2014-07-25 LAB — URINE CYTOLOGY ANCILLARY ONLY
CHLAMYDIA, DNA PROBE: NEGATIVE
Neisseria Gonorrhea: NEGATIVE

## 2014-07-25 LAB — HIV-1 RNA ULTRAQUANT REFLEX TO GENTYP+
HIV 1 RNA Quant: 18388 copies/mL — ABNORMAL HIGH (ref <20)
HIV-1 RNA Quant, Log: 4.26 {Log} — ABNORMAL HIGH (ref <1.30)

## 2014-07-29 ENCOUNTER — Other Ambulatory Visit: Payer: Self-pay | Admitting: *Deleted

## 2014-07-29 DIAGNOSIS — B20 Human immunodeficiency virus [HIV] disease: Secondary | ICD-10-CM

## 2014-07-29 MED ORDER — EMTRICITAB-RILPIVIR-TENOFOV DF 200-25-300 MG PO TABS: 1.0000 | ORAL_TABLET | Freq: Every day | ORAL | Status: DC

## 2014-08-05 LAB — HIV-1 GENOTYPR PLUS

## 2014-08-08 ENCOUNTER — Ambulatory Visit: Payer: Self-pay | Admitting: Internal Medicine

## 2014-09-03 ENCOUNTER — Ambulatory Visit (INDEPENDENT_AMBULATORY_CARE_PROVIDER_SITE_OTHER): Payer: Self-pay | Admitting: Internal Medicine

## 2014-09-03 ENCOUNTER — Encounter: Payer: Self-pay | Admitting: Internal Medicine

## 2014-09-03 VITALS — BP 125/80 | HR 93 | Temp 98.3°F | Wt 167.0 lb

## 2014-09-03 DIAGNOSIS — Z21 Asymptomatic human immunodeficiency virus [HIV] infection status: Secondary | ICD-10-CM

## 2014-09-03 MED ORDER — ELVITEG-COBIC-EMTRICIT-TENOFDF 150-150-200-300 MG PO TABS: 1.0000 | ORAL_TABLET | Freq: Every day | ORAL | Status: DC

## 2014-09-03 NOTE — Progress Notes (Signed)
Patient ID: Rickey Jones, male   DOB: June 08, 1976, 38 y.o.   MRN: [id number]       Patient ID: Rickey Jones, male   DOB: [birth year], 38 y.o.   MRN: [id number]  HPI 38yo M with HIV disease,hx of syphilis, last seen in oct 2014. At his last visit, he was to start complera but did not have follow up thereafter. His recent CD 4 count of 490/VL 18,388. RPR 1:4 unchanged. Was on complera for 3 months last year. Stopped and was not able to come back to clinic, RX lapsed. He denied missing doses with taking complera, no vivid dreams, but did admit that he was concerned that he had to make himself eat 600 cal meal with taking medications, since he was not often hungry. He states that he has not had any significant illnesses in the last year. Works 2 jobs. He has questions to how long he will need to take hiv medications. Concern for trying new medications especially with side effects.  Outpatient Encounter Prescriptions as of 09/03/2014  Medication Sig  . Emtricitab-Rilpivir-Tenofovir 200-25-300 MG TABS Take 1 tablet by mouth daily.     Patient Active Problem List   Diagnosis Date Noted  . RASH AND OTHER NONSPECIFIC SKIN ERUPTION 09/26/2007  . PENILE LESION 08/28/2007  . COUGH 08/28/2007  . TOBACCO USER 01/24/2007  . 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 HEPATITIS B, HX OF 12/26/2006  . HIV DISEASE 11/25/2004     Health Maintenance Due  Topic Date Due  . TETANUS/TDAP  06/17/1997  . INFLUENZA VACCINE  05/25/2014     Review of Systems  Constitutional: Negative for fever, chills, diaphoresis, activity change, appetite change, fatigue and unexpected weight change.  HENT: Negative for congestion, sore throat, rhinorrhea, sneezing, trouble swallowing and sinus pressure.  Eyes: Negative for photophobia and visual disturbance.  Respiratory: Negative for cough, chest tightness, shortness of breath, wheezing and stridor.  Cardiovascular: Negative for chest pain, palpitations and leg swelling.    Gastrointestinal: Negative for nausea, vomiting, abdominal pain, diarrhea, constipation, blood in stool, abdominal distention and anal bleeding.  Genitourinary: Negative for dysuria, hematuria, flank pain and difficulty urinating.  Musculoskeletal: Negative for myalgias, back pain, joint swelling, arthralgias and gait problem.  Skin: Negative for color change, pallor, rash and wound.  Neurological: Negative for dizziness, tremors, weakness and light-headedness.  Hematological: Negative for adenopathy. Does not bruise/bleed easily.  Psychiatric/Behavioral: Negative for behavioral problems, confusion, sleep disturbance, dysphoric mood, decreased concentration and agitation.    Physical Exam   BP 125/80 mmHg  Pulse 93  Temp(Src) 98.3 F (36.8 C) (Oral)  Wt 167 lb (75.751 kg) Physical Exam  Constitutional: He is oriented to person, place, and time. He appears well-developed and well-nourished. No distress.  HENT: poor dentition.Mouth/Throat: Oropharynx is clear and moist. No oropharyngeal exudate.  Cardiovascular: Normal rate, regular rhythm and normal heart sounds. Exam reveals no gallop and no friction rub.  No murmur heard.  Pulmonary/Chest: Effort normal and breath sounds normal. No respiratory distress. He has no wheezes.  Abdominal: Soft. Bowel sounds are normal. He exhibits no distension. There is no tenderness.  Lymphadenopathy:  He has no cervical adenopathy.  Neurological: He is alert and oriented to person, place, and time.  Skin: Skin is warm and dry. No rash noted. No erythema.  Psychiatric: He has a normal mood and affect. His behavior is normal.    Lab Results  Component Value Date   CD4TCELL 29* 07/24/2014   Lab Results  Component Value Date  CD4TABS 490 07/24/2014   CD4TABS 480 07/23/2013   CD4TABS 890 06/08/2012   Lab Results  Component Value Date   HIV1RNAQUANT 18388* 07/24/2014   Lab Results  Component Value Date   HEPBSAB NEG 12/17/2010   No results  found for: RPR  CBC Lab Results  Component Value Date   WBC 4.6 07/24/2014   RBC 5.13 07/24/2014   HGB 15.5 07/24/2014   HCT 44.8 07/24/2014   PLT 148* 07/24/2014   MCV 87.3 07/24/2014   MCH 30.2 07/24/2014   MCHC 34.6 07/24/2014   RDW 14.0 07/24/2014   LYMPHSABS 1.5 07/24/2014   MONOABS 0.5 07/24/2014   EOSABS 0.0 07/24/2014   BASOSABS 0.0 07/24/2014   BMET Lab Results  Component Value Date   NA 139 07/24/2014   K 4.2 07/24/2014   CL 104 07/24/2014   CO2 27 07/24/2014   GLUCOSE 83 07/24/2014   BUN 13 07/24/2014   CREATININE 0.92 07/24/2014   CALCIUM 9.0 07/24/2014   GFRNONAA >89 07/24/2014   GFRAA >89 07/24/2014     Assessment and Plan  hiv = we will change him to stribild. Will see back in 4-6 wk to repeat labs and see how he is tolerating new medication. Spent 15-20 minutes discussing adherence of medication. Discussed that drug holidays have shown worsening overall outcomes.  Health maintnenace = refused flu vaccine  Tobacco use = precontemplative in quiting  At next appt will need to discuss hiv prevention, if he is in relationship, establish trust in clinic to encourage follow up more so than annual visits

## 2014-11-04 ENCOUNTER — Ambulatory Visit: Payer: Self-pay | Admitting: Internal Medicine

## 2014-11-27 ENCOUNTER — Other Ambulatory Visit (INDEPENDENT_AMBULATORY_CARE_PROVIDER_SITE_OTHER): Payer: Self-pay

## 2014-11-27 DIAGNOSIS — B20 Human immunodeficiency virus [HIV] disease: Secondary | ICD-10-CM

## 2014-11-27 DIAGNOSIS — Z113 Encounter for screening for infections with a predominantly sexual mode of transmission: Secondary | ICD-10-CM

## 2014-11-27 LAB — COMPREHENSIVE METABOLIC PANEL
ALK PHOS: 59 U/L (ref 39–117)
ALT: 20 U/L (ref 0–53)
AST: 15 U/L (ref 0–37)
Albumin: 3.9 g/dL (ref 3.5–5.2)
BUN: 9 mg/dL (ref 6–23)
CALCIUM: 9.1 mg/dL (ref 8.4–10.5)
CO2: 26 mEq/L (ref 19–32)
Chloride: 104 mEq/L (ref 96–112)
Creat: 1.13 mg/dL (ref 0.50–1.35)
Glucose, Bld: 89 mg/dL (ref 70–99)
Potassium: 4.4 mEq/L (ref 3.5–5.3)
Sodium: 140 mEq/L (ref 135–145)
TOTAL PROTEIN: 6.5 g/dL (ref 6.0–8.3)
Total Bilirubin: 0.3 mg/dL (ref 0.2–1.2)

## 2014-11-27 LAB — CBC WITH DIFFERENTIAL/PLATELET
BASOS ABS: 0 10*3/uL (ref 0.0–0.1)
Basophils Relative: 0 % (ref 0–1)
Eosinophils Absolute: 0 10*3/uL (ref 0.0–0.7)
Eosinophils Relative: 1 % (ref 0–5)
HEMATOCRIT: 45.7 % (ref 39.0–52.0)
Hemoglobin: 16.1 g/dL (ref 13.0–17.0)
Lymphocytes Relative: 45 % (ref 12–46)
Lymphs Abs: 2.2 10*3/uL (ref 0.7–4.0)
MCH: 31.1 pg (ref 26.0–34.0)
MCHC: 35.2 g/dL (ref 30.0–36.0)
MCV: 88.2 fL (ref 78.0–100.0)
MPV: 10.8 fL (ref 8.6–12.4)
Monocytes Absolute: 0.5 10*3/uL (ref 0.1–1.0)
Monocytes Relative: 10 % (ref 3–12)
NEUTROS PCT: 44 % (ref 43–77)
Neutro Abs: 2.2 10*3/uL (ref 1.7–7.7)
Platelets: 163 10*3/uL (ref 150–400)
RBC: 5.18 MIL/uL (ref 4.22–5.81)
RDW: 14.8 % (ref 11.5–15.5)
WBC: 4.9 10*3/uL (ref 4.0–10.5)

## 2014-11-27 LAB — RPR: RPR: REACTIVE — AB

## 2014-11-27 LAB — RPR TITER: RPR Titer: 1:4 {titer}

## 2014-11-28 LAB — T-HELPER CELL (CD4) - (RCID CLINIC ONLY)
CD4 % Helper T Cell: 35 % (ref 33–55)
CD4 T Cell Abs: 780 /uL (ref 400–2700)

## 2014-11-28 LAB — HIV-1 RNA QUANT-NO REFLEX-BLD
HIV 1 RNA Quant: <20 copies/mL (ref <20)
HIV-1 RNA Quant, Log: <1.3 {Log} (ref <1.30)

## 2014-11-28 LAB — FLUORESCENT TREPONEMAL AB(FTA)-IGG-BLD: Fluorescent Treponemal ABS: REACTIVE — AB

## 2014-12-19 ENCOUNTER — Ambulatory Visit (INDEPENDENT_AMBULATORY_CARE_PROVIDER_SITE_OTHER): Payer: Self-pay | Admitting: Internal Medicine

## 2014-12-19 ENCOUNTER — Encounter: Payer: Self-pay | Admitting: Internal Medicine

## 2014-12-19 VITALS — BP 112/69 | HR 76 | Temp 98.2°F | Wt 167.0 lb

## 2014-12-19 DIAGNOSIS — Z720 Tobacco use: Secondary | ICD-10-CM

## 2014-12-19 DIAGNOSIS — Z716 Tobacco abuse counseling: Secondary | ICD-10-CM

## 2014-12-19 DIAGNOSIS — B20 Human immunodeficiency virus [HIV] disease: Secondary | ICD-10-CM

## 2014-12-19 DIAGNOSIS — Z8619 Personal history of other infectious and parasitic diseases: Secondary | ICD-10-CM

## 2014-12-19 NOTE — Progress Notes (Signed)
Patient ID: Rickey Jones, male   DOB: [birth year], 39 y.o.   MRN: [id number]        Patient ID: Rickey Jones, male   DOB: 08-01-1976, 39 y.o.   MRN: [id number]  HPI Rickey Jones is a 39yo M with HIV disease, on stribild, CD 4 count of 780/LV<20 on early February. Doing well with stribild. Denies difficulty taking medications. He does miss roughly 1-2 doses per month. Somewhat inconsistent with when he takes meds. He is currently working 4-0hr/wk over 2 jobs. Smokes 1 ppd but precontemplative with smoking cessation.   Outpatient Encounter Prescriptions as of 12/19/2014  Medication Sig  . elvitegravir-cobicistat-emtricitabine-tenofovir (STRIBILD) 150-150-200-300 MG TABS tablet Take 1 tablet by mouth daily with breakfast.     Patient Active Problem List   Diagnosis Date Noted  . RASH AND OTHER NONSPECIFIC SKIN ERUPTION 09/26/2007  . PENILE LESION 08/28/2007  . COUGH 08/28/2007  . TOBACCO USER 01/24/2007  . 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 HEPATITIS B, HX OF 12/26/2006  . HIV DISEASE 11/25/2004     Health Maintenance Due  Topic Date Due  . TETANUS/TDAP  06/17/1997  . INFLUENZA VACCINE  05/25/2014     Review of Systems  Constitutional: Negative for fever, chills, diaphoresis, activity change, appetite change, fatigue and unexpected weight change.  HENT: Negative for congestion, sore throat, rhinorrhea, sneezing, trouble swallowing and sinus pressure.  Eyes: Negative for photophobia and visual disturbance.  Respiratory: Negative for cough, chest tightness, shortness of breath, wheezing and stridor.  Cardiovascular: Negative for chest pain, palpitations and leg swelling.  Gastrointestinal: Negative for nausea, vomiting, abdominal pain, diarrhea, constipation, blood in stool, abdominal distention and anal bleeding.  Genitourinary: Negative for dysuria, hematuria, flank pain and difficulty urinating.  Musculoskeletal: Negative for myalgias, back pain, joint swelling, arthralgias and gait  problem.  Skin: Negative for color change, pallor, rash and wound.  Neurological: Negative for dizziness, tremors, weakness and light-headedness.  Hematological: Negative for adenopathy. Does not bruise/bleed easily.  Psychiatric/Behavioral: Negative for behavioral problems, confusion, sleep disturbance, dysphoric mood, decreased concentration and agitation.    Physical Exam   BP 112/69 mmHg  Pulse 76  Temp(Src) 98.2 F (36.8 C) (Oral)  Wt 167 lb (75.751 kg) Physical Exam  Constitutional: He is oriented to person, place, and time. He appears well-developed and well-nourished. No distress.  HENT:  Mouth/Throat: Oropharynx is clear and moist. No oropharyngeal exudate.  Cardiovascular: Normal rate, regular rhythm and normal heart sounds. Exam reveals no gallop and no friction rub.  No murmur heard.  Pulmonary/Chest: Effort normal and breath sounds normal. No respiratory distress. He has no wheezes.  Abdominal: Soft. Bowel sounds are normal. He exhibits no distension. There is no tenderness.  Lymphadenopathy:  He has no cervical adenopathy.  Neurological: He is alert and oriented to person, place, and time.  Skin: Skin is warm and dry. No rash noted. No erythema.  Psychiatric: He has a normal mood and affect. His behavior is normal.    Lab Results  Component Value Date   CD4TCELL 35 11/27/2014   Lab Results  Component Value Date   CD4TABS 780 11/27/2014   CD4TABS 490 07/24/2014   CD4TABS 480 07/23/2013   Lab Results  Component Value Date   HIV1RNAQUANT <20 11/27/2014   Lab Results  Component Value Date   HEPBSAB NEG 12/17/2010   No results found for: RPR  CBC Lab Results  Component Value Date   WBC 4.9 11/27/2014   RBC 5.18 11/27/2014   HGB 16.1 11/27/2014  HCT 45.7 11/27/2014   PLT 163 11/27/2014   MCV 88.2 11/27/2014   MCH 31.1 11/27/2014   MCHC 35.2 11/27/2014   RDW 14.8 11/27/2014   LYMPHSABS 2.2 11/27/2014   MONOABS 0.5 11/27/2014   EOSABS 0.0  11/27/2014   BASOSABS 0.0 11/27/2014   BMET Lab Results  Component Value Date   NA 140 11/27/2014   K 4.4 11/27/2014   CL 104 11/27/2014   CO2 26 11/27/2014   GLUCOSE 89 11/27/2014   BUN 9 11/27/2014   CREATININE 1.13 11/27/2014   CALCIUM 9.1 11/27/2014   GFRNONAA >89 07/24/2014   GFRAA >89 07/24/2014     Assessment and Plan   hiv disease= spoke that he is doing well with his meds and needs to aim for 100% adherence.   Hx of hep b= although hep b s ag negative, and hep b s ab neg. Will recheck hep b s ag and hep b s ab to decide if he needs vaccination  Smoking cessation = counseled patient on smoking cessation x 3 minutes  Health maintenance = to get hep a vaccine and possibly hep b at next visit  Labs 2 wk prior

## 2019-03-20 ENCOUNTER — Ambulatory Visit: Payer: Self-pay

## 2019-03-20 ENCOUNTER — Encounter: Payer: Self-pay | Admitting: Family

## 2019-03-20 ENCOUNTER — Other Ambulatory Visit: Payer: Self-pay

## 2019-03-20 ENCOUNTER — Other Ambulatory Visit: Payer: Self-pay | Admitting: *Deleted

## 2019-03-20 DIAGNOSIS — Z113 Encounter for screening for infections with a predominantly sexual mode of transmission: Secondary | ICD-10-CM

## 2019-03-20 DIAGNOSIS — Z79899 Other long term (current) drug therapy: Secondary | ICD-10-CM

## 2019-03-20 DIAGNOSIS — B20 Human immunodeficiency virus [HIV] disease: Secondary | ICD-10-CM

## 2019-03-21 LAB — T-HELPER CELL (CD4) - (RCID CLINIC ONLY)
CD4 % Helper T Cell: 3 % — ABNORMAL LOW (ref 33–65)
CD4 T Cell Abs: <35 /uL — ABNORMAL LOW (ref 400–1790)

## 2019-03-21 LAB — URINE CYTOLOGY ANCILLARY ONLY
Chlamydia: NEGATIVE
Neisseria Gonorrhea: NEGATIVE

## 2019-03-22 ENCOUNTER — Emergency Department (HOSPITAL_COMMUNITY): Payer: Self-pay

## 2019-03-22 ENCOUNTER — Ambulatory Visit (HOSPITAL_COMMUNITY)
Admission: EM | Admit: 2019-03-22 | Discharge: 2019-03-22 | Disposition: A | Discharge status: Home / Self Care | Payer: Self-pay | Attending: Internal Medicine | Admitting: Internal Medicine

## 2019-03-22 ENCOUNTER — Other Ambulatory Visit: Payer: Self-pay

## 2019-03-22 ENCOUNTER — Encounter (HOSPITAL_COMMUNITY): Payer: Self-pay | Admitting: Emergency Medicine

## 2019-03-22 ENCOUNTER — Encounter (HOSPITAL_COMMUNITY): Payer: Self-pay

## 2019-03-22 ENCOUNTER — Emergency Department (HOSPITAL_COMMUNITY)
Admission: EM | Admit: 2019-03-22 | Discharge: 2019-03-22 | Disposition: A | Discharge status: Home / Self Care | Payer: Self-pay | Attending: Emergency Medicine | Admitting: Emergency Medicine

## 2019-03-22 DIAGNOSIS — R531 Weakness: Secondary | ICD-10-CM | POA: Insufficient documentation

## 2019-03-22 DIAGNOSIS — Z5320 Procedure and treatment not carried out because of patient's decision for unspecified reasons: Secondary | ICD-10-CM | POA: Insufficient documentation

## 2019-03-22 DIAGNOSIS — B370 Candidal stomatitis: Secondary | ICD-10-CM

## 2019-03-22 DIAGNOSIS — F17210 Nicotine dependence, cigarettes, uncomplicated: Secondary | ICD-10-CM | POA: Insufficient documentation

## 2019-03-22 DIAGNOSIS — R509 Fever, unspecified: Secondary | ICD-10-CM

## 2019-03-22 DIAGNOSIS — J189 Pneumonia, unspecified organism: Secondary | ICD-10-CM | POA: Insufficient documentation

## 2019-03-22 DIAGNOSIS — K209 Esophagitis, unspecified without bleeding: Secondary | ICD-10-CM

## 2019-03-22 DIAGNOSIS — R5383 Other fatigue: Secondary | ICD-10-CM | POA: Insufficient documentation

## 2019-03-22 DIAGNOSIS — Z79899 Other long term (current) drug therapy: Secondary | ICD-10-CM | POA: Insufficient documentation

## 2019-03-22 DIAGNOSIS — B20 Human immunodeficiency virus [HIV] disease: Secondary | ICD-10-CM | POA: Insufficient documentation

## 2019-03-22 HISTORY — DX: Human immunodeficiency virus (HIV) disease: B20

## 2019-03-22 HISTORY — DX: Asymptomatic human immunodeficiency virus (hiv) infection status: Z21

## 2019-03-22 LAB — CBC
HCT: 38.3 % — ABNORMAL LOW (ref 39.0–52.0)
Hemoglobin: 12.7 g/dL — ABNORMAL LOW (ref 13.0–17.0)
MCH: 28.5 pg (ref 26.0–34.0)
MCHC: 33.2 g/dL (ref 30.0–36.0)
MCV: 85.9 fL (ref 80.0–100.0)
Platelets: 144 10*3/uL — ABNORMAL LOW (ref 150–400)
RBC: 4.46 MIL/uL (ref 4.22–5.81)
RDW: 13.3 % (ref 11.5–15.5)
WBC: 3.6 10*3/uL — ABNORMAL LOW (ref 4.0–10.5)
nRBC: 0 % (ref 0.0–0.2)

## 2019-03-22 LAB — BASIC METABOLIC PANEL
Anion gap: 11 (ref 5–15)
BUN: 6 mg/dL (ref 6–20)
CO2: 27 mmol/L (ref 22–32)
Calcium: 9 mg/dL (ref 8.9–10.3)
Chloride: 100 mmol/L (ref 98–111)
Creatinine, Ser: 0.82 mg/dL (ref 0.61–1.24)
GFR calc Af Amer: >60 mL/min (ref >60)
GFR calc non Af Amer: >60 mL/min (ref >60)
Glucose, Bld: 150 mg/dL — ABNORMAL HIGH (ref 70–99)
Potassium: 4 mmol/L (ref 3.5–5.1)
Sodium: 138 mmol/L (ref 135–145)

## 2019-03-22 LAB — TROPONIN I: Troponin I: <0.03 ng/mL (ref <0.03)

## 2019-03-22 LAB — LACTIC ACID, PLASMA: Lactic Acid, Venous: 0.8 mmol/L (ref 0.5–1.9)

## 2019-03-22 MED ORDER — SODIUM CHLORIDE 0.9 % IV BOLUS: 1000.0000 mL | Freq: Once | INTRAVENOUS | Status: DC

## 2019-03-22 MED ORDER — ONDANSETRON HCL 4 MG/2ML IJ SOLN: 4.0000 mg | Freq: Once | INTRAMUSCULAR | Status: DC

## 2019-03-22 MED ORDER — FLUCONAZOLE 100 MG PO TABS
100.0000 mg | ORAL_TABLET | Freq: Once | ORAL | Status: AC
  Administered 2019-03-22: 15:00:00 100 mg via ORAL
  Filled 2019-03-22: qty 1

## 2019-03-22 MED ORDER — DOXYCYCLINE HYCLATE 100 MG PO TABS
100.0000 mg | ORAL_TABLET | Freq: Once | ORAL | Status: AC
  Administered 2019-03-22: 15:00:00 100 mg via ORAL
  Filled 2019-03-22: qty 1

## 2019-03-22 MED ORDER — SODIUM CHLORIDE 0.9 % IV BOLUS
1000.0000 mL | Freq: Once | INTRAVENOUS | Status: AC
  Administered 2019-03-22: 1000 mL via INTRAVENOUS

## 2019-03-22 MED ORDER — MAGIC MOUTHWASH
2.0000 mL | Freq: Once | ORAL | Status: AC
  Administered 2019-03-22: 2 mL via ORAL
  Filled 2019-03-22: qty 5

## 2019-03-22 MED ORDER — IOHEXOL 350 MG/ML SOLN
80.0000 mL | Freq: Once | INTRAVENOUS | Status: AC | PRN
  Administered 2019-03-22: 14:00:00 80 mL via INTRAVENOUS

## 2019-03-22 MED ORDER — MAGIC MOUTHWASH: ORAL | 0 refills | Status: DC

## 2019-03-22 MED ORDER — FLUCONAZOLE 100 MG PO TABS: 100.0000 mg | ORAL_TABLET | Freq: Every day | ORAL | 0 refills | Status: DC

## 2019-03-22 MED ORDER — DOXYCYCLINE HYCLATE 100 MG PO CAPS: 100.0000 mg | ORAL_CAPSULE | Freq: Two times a day (BID) | ORAL | 0 refills | Status: AC

## 2019-03-22 NOTE — ED Notes (Signed)
Pt states he was tested for covid-19 at the health dept and states I got the results yesterday that I was negative.   Nurse Navigator communication: The patient has a cell phone at bedside and is able to give updates to family.

## 2019-03-22 NOTE — ED Provider Notes (Signed)
[facility]    CSN: [id number] Arrival date & time: 03/22/19  6283     History   Chief Complaint Chief Complaint  Patient presents with  . thick mucus / chest congestion    HPI Rickey Jones is a 43 y.o. male.   Patient is a 43 year old male with past medical history of HIV.  He presents today with worsening throat pain, pain with swallowing, thick mucus and cough.  Symptoms have been going on for many months.  He reports that he is currently not taking his HIV medication and has not been on that for many months.  He did have labs drawn at the infectious disease clinic on Tuesday which revealed a CD4 count of 35.  He is reporting that he has had a significant amount of weight loss.  He does have low-grade fever here today and is tachycardic.  He has been having mild chest pain with swallowing and breathing.  He is a current everyday smoker. Overall fatigue and not feeling well.   ROS per HPI      Past Medical History:  Diagnosis Date  . HIV (human immunodeficiency virus infection) [facility])     Patient Active Problem List   Diagnosis Date Noted  . RASH AND OTHER NONSPECIFIC SKIN ERUPTION 09/26/2007  . PENILE LESION 08/28/2007  . COUGH 08/28/2007  . TOBACCO USER 01/24/2007  . 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 HEPATITIS B, HX OF 12/26/2006  . HIV DISEASE 11/25/2004    History reviewed. No pertinent surgical history.     Home Medications    Prior to Admission medications   Medication Sig Start Date End Date Taking? Authorizing Provider  elvitegravir-cobicistat-emtricitabine-tenofovir (STRIBILD) 150-150-200-300 MG TABS tablet Take 1 tablet by mouth daily with breakfast. 09/03/14   Carlyle Basques, MD    Family History History reviewed. No pertinent family history.  Social History Social History   Tobacco Use  . Smoking status: Current Every Day Smoker    Packs/day: 1.00    Years: 15.00    Pack years: 15.00    Types: Cigarettes  . Smokeless  tobacco: Never Used  Substance Use Topics  . Alcohol use: Yes    Alcohol/week: 1.0 standard drinks    Types: 1 Standard drinks or equivalent per week    Comment: occasional  . Drug use: No     Allergies   Cephalexin   Review of Systems Review of Systems   Physical Exam Triage Vital Signs ED Triage Vitals  Enc Vitals Group     BP 03/22/19 0858 110/83     Pulse Rate 03/22/19 0858 (!) 141     Resp 03/22/19 0858 18     Temp 03/22/19 0858 99.6 F (37.6 C)     Temp src --      SpO2 03/22/19 0858 99 %     Weight --      Height --      Head Circumference --      Peak Flow --      Pain Score 03/22/19 0901 8     Pain Loc --      Pain Edu? --      Excl. in [place]? --    No data found.  Updated Vital Signs BP 110/83 (BP Location: Right Arm)   Pulse (!) 141   Temp 99.6 F (37.6 C)   Resp 18   SpO2 99%   Visual Acuity Right Eye Distance:   Left Eye Distance:   Bilateral Distance:  Right Eye Near:   Left Eye Near:    Bilateral Near:     Physical Exam Vitals signs and nursing note reviewed.  Constitutional:      Appearance: He is ill-appearing.     Comments: Emaciated   HENT:     Head: Normocephalic and atraumatic.     Nose: Nose normal.     Mouth/Throat:     Mouth: Mucous membranes are dry.     Dentition: Gingival swelling and dental caries present.     Pharynx: Oropharyngeal exudate and posterior oropharyngeal erythema present.     Tonsils: 0 on the right. 0 on the left.     Comments: Thrush noted to posterior oropharynx.  Cardiovascular:     Rate and Rhythm: Tachycardia present.  Pulmonary:     Effort: Pulmonary effort is normal.     Comments: Decreased lung sound in right lower lobe Musculoskeletal: Normal range of motion.  Skin:    General: Skin is warm and dry.     Coloration: Skin is pale.  Neurological:     General: No focal deficit present.     Mental Status: He is alert.  Psychiatric:     Comments: flat      UC Treatments / Results   Labs (all labs ordered are listed, but only abnormal results are displayed) Labs Reviewed - No data to display  EKG None  Radiology No results found.  Procedures Procedures (including critical care time)  Medications Ordered in UC Medications - No data to display  Initial Impression / Assessment and Plan / UC Course  I have reviewed the triage vital signs and the nursing notes.  Pertinent labs & imaging results that were available during my care of the patient were reviewed by me and considered in my medical decision making (see chart for details).     Patient is a 43 year old male with past medical history of HIV. CD4 count is 35. He has a multitude of different things going on to include fever, tachycardia and possible multiple infections. I am worried that he is borderline septic Sending to the ER for further evaluation and management. Pt apprehensive but agrees.  Final Clinical Impressions(s) / UC Diagnoses   Final diagnoses:  Fever, unspecified  Thrush     Discharge Instructions     Based on your lab work, elevated hear rate and possible infection I am sending you to the ER.  You need rehydration, imaging and possible IV antibiotics.     ED Prescriptions    None     Controlled Substance Prescriptions [place] Controlled Substance Registry consulted? No   Loura Halt A, NP 03/22/19 1000

## 2019-03-22 NOTE — Discharge Instructions (Signed)
Take antibiotics as directed. Please take all of your antibiotics until finished.  Take fluconazole as directed.  Use Magic mouthwash as directed.  Follow-up with the referred infectious disease office.  Keep your appointment as previously scheduled.  Return the emergency department for any fevers, chest pain, difficulty breathing or any other worsening or concerning symptoms.

## 2019-03-22 NOTE — ED Notes (Signed)
Patient verbalizes understanding of discharge instructions. Opportunity for questioning and answers were provided. Armband removed by staff, pt discharged from ED.  

## 2019-03-22 NOTE — ED Triage Notes (Signed)
Pt states she has chest congestion. Pt states he has been producing thick mucus that's making him sick. Pt states he's losing weight rapidly. He not sure why.

## 2019-03-22 NOTE — ED Notes (Signed)
Patient transported to X-ray 

## 2019-03-22 NOTE — ED Provider Notes (Signed)
[facility] EMERGENCY DEPARTMENT Provider Note   CSN: [id number] Arrival date & time: 03/22/19  1035    History   Chief Complaint Chief Complaint  Patient presents with   Fatigue   Dysphagia   Generalized Weakness    HPI Rickey Jones is a 43 y.o. male with PMH/o HIV (CD4 count of 35 as of 03/20/19) who presents to the emergency department for evaluation of pain in mouth, pain in throat, "congestion" in chest, cough, weight loss, generalized weakness, fatigue.  He states that the symptoms have been ongoing for several months.  He does not do any exact amount of weight that he is lost.  Clothes are fitting looser.  He states that he would have some irritation in his mouth that would make it more difficult to swallow.  He states that this worsened significantly about 2 months ago.  He states he noticed some white spots in his mouth that continued to grow.  Patient states that he is still able to tolerate his secretions and liquids.  He states that he is able to tolerate solids if he chews them up very finely but states he has significant pain with attempting to swallow.  He feels like the pain when swallowing, radiates from his throat all the way into his chest and notes some congestion in his chest.  He states he is not having any pain in his chest.  He states occasionally, he will cough up some phlegm.  He denies any hemoptysis.  He has not noted any congestion, rhinorrhea.  He states he always has a "HIV fever" and states that while he does not measure his temperature at home, he always just feels hot.  He states he has always diaphoretic.  Patient states that he went to urgent care today for evaluation of symptoms and they sent him to the emergency department for further evaluation.  Patient states that his symptoms and not necessarily changed today but states that he was trying to get paperwork and everything in order before he came to get it evaluated.  He states that he  has an appointment scheduled with infectious disease on 04/03/2019.  He states that the last time he saw them was approximately 4 years ago and he states that he has not been on any HIV medications for the last 4 years.  Patient denies any shortness of breath, abdominal pain, vomiting, numbness/weakness of his arms or legs, difficulty ambulating. He denies any exogenous hormone use, recent immobilization, prior history of DVT/PE, recent surgery, leg swelling, or long travel.     The history is provided by the patient.    Past Medical History:  Diagnosis Date   HIV (human immunodeficiency virus infection) [facility])     Patient Active Problem List   Diagnosis Date Noted   RASH AND OTHER NONSPECIFIC SKIN ERUPTION 09/26/2007   PENILE LESION 08/28/2007   COUGH 08/28/2007   TOBACCO USER 01/24/2007   HUMAN PAPILLOMAVIRUS 12/26/2006   HEPATITIS B, HX OF 12/26/2006   HIV DISEASE 11/25/2004    History reviewed. No pertinent surgical history.      Home Medications    Prior to Admission medications   Medication Sig Start Date End Date Taking? Authorizing Provider  doxycycline (VIBRAMYCIN) 100 MG capsule Take 1 capsule (100 mg total) by mouth 2 (two) times daily for 7 days. 03/22/19 03/29/19  Volanda Napoleon, PA-C  elvitegravir-cobicistat-emtricitabine-tenofovir (STRIBILD) 150-150-200-300 MG TABS tablet Take 1 tablet by mouth daily with breakfast. 09/03/14  Carlyle Basques, MD  fluconazole (DIFLUCAN) 100 MG tablet Take 1 tablet (100 mg total) by mouth daily. 03/22/19   Volanda Napoleon, PA-C  magic mouthwash SOLN Take 2-3 ml three times a day before meals. Swish and spit. 03/22/19   Volanda Napoleon, PA-C    Family History No family history on file.  Social History Social History   Tobacco Use   Smoking status: Current Every Day Smoker    Packs/day: 1.00    Years: 15.00    Pack years: 15.00    Types: Cigarettes   Smokeless tobacco: Never Used  Substance Use Topics    Alcohol use: Yes    Alcohol/week: 1.0 standard drinks    Types: 1 Standard drinks or equivalent per week    Comment: occasional   Drug use: No     Allergies   Cephalexin   Review of Systems Review of Systems  Constitutional: Positive for activity change, appetite change, diaphoresis, fatigue, fever and unexpected weight change.  HENT: Positive for mouth sores, sore throat and trouble swallowing.   Respiratory: Positive for cough. Negative for shortness of breath.   Cardiovascular: Negative for chest pain.  Gastrointestinal: Positive for nausea. Negative for abdominal pain and vomiting.  Genitourinary: Negative for dysuria and hematuria.  Neurological: Negative for weakness, numbness and headaches.  All other systems reviewed and are negative.    Physical Exam Updated Vital Signs BP 119/77 (BP Location: Right Arm)    Pulse 77    Temp 98 F (36.7 C) (Oral)    Resp 20    Wt 63.5 kg    SpO2 99%    BMI 20.09 kg/m   Physical Exam Vitals signs and nursing note reviewed.  Constitutional:      Appearance: Normal appearance. He is well-developed and underweight.     Comments: Ill-appearing  HENT:     Head: Normocephalic and atraumatic.     Mouth/Throat:     Lips: Lesions present.     Mouth: Mucous membranes are dry.     Comments: Scattered white plaques noted to hard palate, uvula, posterior oropharynx.  Airway is patent, phonation is intact.  Uvula is midline.  No intraoral swelling. Eyes:     General: Lids are normal.     Conjunctiva/sclera: Conjunctivae normal.     Pupils: Pupils are equal, round, and reactive to light.  Neck:     Musculoskeletal: Full passive range of motion without pain.  Cardiovascular:     Rate and Rhythm: Regular rhythm. Tachycardia present.     Pulses: Normal pulses.     Heart sounds: Normal heart sounds. No murmur. No friction rub. No gallop.   Pulmonary:     Effort: Pulmonary effort is normal.     Breath sounds: Normal breath sounds.      Comments: Lungs clear to auscultation bilaterally.  Symmetric chest rise.  No wheezing, rales, rhonchi. Abdominal:     Palpations: Abdomen is soft. Abdomen is not rigid.     Tenderness: There is no abdominal tenderness. There is no guarding.     Comments: Abdomen is soft, non-distended, non-tender. No rigidity, No guarding. No peritoneal signs.  Musculoskeletal: Normal range of motion.     Comments: BLE are symmetric in appearance.any overlying warmth, erythema, edema.  Skin:    General: Skin is warm and dry.     Capillary Refill: Capillary refill takes less than 2 seconds.  Neurological:     Mental Status: He is alert and oriented to person, place,  and time.  Psychiatric:        Speech: Speech normal.      ED Treatments / Results  Labs (all labs ordered are listed, but only abnormal results are displayed) Labs Reviewed  BASIC METABOLIC PANEL - Abnormal; Notable for the following components:      Result Value   Glucose, Bld 150 (*)    All other components within normal limits  CBC - Abnormal; Notable for the following components:   WBC 3.6 (*)    Hemoglobin 12.7 (*)    HCT 38.3 (*)    Platelets 144 (*)    All other components within normal limits  SARS CORONAVIRUS 2 (HOSPITAL ORDER, [facility])  CULTURE, BLOOD (ROUTINE X 2)  CULTURE, BLOOD (ROUTINE X 2)  TROPONIN I  LACTIC ACID, PLASMA  URINALYSIS, ROUTINE W REFLEX MICROSCOPIC  LACTIC ACID, PLASMA    EKG EKG Interpretation  Date/Time:  Thursday Mar 22 2019 10:44:28 EDT Ventricular Rate:  138 PR Interval:  140 QRS Duration: 76 QT Interval:  280 QTC Calculation: 424 R Axis:   96 Text Interpretation:  Sinus tachycardia Biatrial enlargement Rightward axis Abnormal ECG No old tracing to compare Confirmed by Isla Pence [id number]) on 03/22/2019 11:54:51 AM   Radiology Dg Chest 2 View  Result Date: 03/22/2019 CLINICAL DATA:  Cough, fever. EXAM: CHEST - 2 VIEW COMPARISON:  None. FINDINGS:  The heart size and mediastinal contours are within normal limits. Both lungs are clear. The visualized skeletal structures are unremarkable. IMPRESSION: No active cardiopulmonary disease. Electronically Signed   By: Marijo Conception M.D.   On: 03/22/2019 11:38   Ct Angio Chest Pe W And/or Wo Contrast  Result Date: 03/22/2019 CLINICAL DATA:  Chest congestion, weight loss EXAM: CT ANGIOGRAPHY CHEST WITH CONTRAST TECHNIQUE: Multidetector CT imaging of the chest was performed using the standard protocol during bolus administration of intravenous contrast. Multiplanar CT image reconstructions and MIPs were obtained to evaluate the vascular anatomy. CONTRAST:  57mL OMNIPAQUE IOHEXOL 350 MG/ML SOLN COMPARISON:  None. FINDINGS: Cardiovascular: Satisfactory opacification of the pulmonary arteries to the segmental level. No evidence of pulmonary embolism. Normal heart size. No pericardial effusion. Mediastinum/Nodes: No enlarged mediastinal, hilar, or axillary lymph nodes. Thyroid gland and trachea are normal. There is mild, diffuse thickening of the esophagus with internal debris. Lungs/Pleura: There is minimal, clustered nodularity of the lingula (series 6, image 25). No pleural effusion or pneumothorax. Upper Abdomen: No acute abnormality. The partially imaged spleen is enlarged, measuring at least 13.4 cm in maximum span. Musculoskeletal: No chest wall abnormality. No acute or significant osseous findings. Review of the MIP images confirms the above findings. IMPRESSION: 1.  Negative examination for pulmonary embolism. 2. There is minimal, clustered infectious or inflammatory nodularity of the lingula (series 6, image 25). 3. There is mild, diffuse thickening of the esophagus with internal debris, of uncertain significance, possibly related to reflux esophagitis. 4.  Partially imaged splenomegaly. Electronically Signed   By: Eddie Candle M.D.   On: 03/22/2019 13:46    Procedures Procedures (including critical care  time)  Medications Ordered in ED Medications  ondansetron (ZOFRAN) injection 4 mg (0 mg Intravenous Hold 03/22/19 1258)  sodium chloride 0.9 % bolus 1,000 mL (1,000 mLs Intravenous Refused 03/22/19 1501)  sodium chloride 0.9 % bolus 1,000 mL (0 mLs Intravenous Stopped 03/22/19 1522)  iohexol (OMNIPAQUE) 350 MG/ML injection 80 mL (80 mLs Intravenous Contrast Given 03/22/19 1337)  fluconazole (DIFLUCAN) tablet 100 mg (100 mg  Oral Given 03/22/19 1506)  doxycycline (VIBRA-TABS) tablet 100 mg (100 mg Oral Given 03/22/19 1506)  magic mouthwash (2 mLs Oral Given 03/22/19 1514)     Initial Impression / Assessment and Plan / ED Course  I have reviewed the triage vital signs and the nursing notes.  Pertinent labs & imaging results that were available during my care of the patient were reviewed by me and considered in my medical decision making (see chart for details).        43 y.o. M with PMH/o HIV (CD4 of 35 as of 03/20/19) who presents for evaluation of difficulty swallowing, pain in throat, cough, generalized weakness, weight loss, fatigue.  He states is been ongoing for several months but feels like worse in the last 2 months.  He states he did not want to come in because he did not have "everything in order."  He states he finally got his papers and appointments in order and felt like he could get evaluated.  He went to urgent care who prompted come here.  He denies any chest pain but does report some "congestion in chest" that he relates from his sore throat.  He has not any difficulty breathing.  Initial ED arrival, temperature is 99.7, he is tachycardic with heart rate in the 140s.  Vitals otherwise stable.  On exam, lungs clear to auscultation.  Benign abdominal exam.  He does have evidence of oral candidiasis noted in throat.  Question if this extends into esophagitis secondary to candidal infection. Plan for labs, fluids.   BMP shows normal BUN/Cr. Trop negative. CBC shows leukopenia of 3.6. Hgb  of 12.7, HCT is 38.3. CXR shows no acute abnormalities.   CTA of chest shows clustered infectious or inflammatory nodularity of the lingular. There is also mild diffuse thickening of the esophagus with internal debris.   Discussed with patient. Re-evaluation. Vitals improved. His HR while talking to him is 95-98. BP stable. I discussed CT findings with patient. I did offer admission given concern for HIV with opportunistic infections. I discussed with him that I am concerned for AIDs. Patient does not want to be admitted at this time. I discussed at length with patient regarding the severity of his HIV. He expresses understanding and does not want to be admitted. He denies any SI.   Discussed patient with Dr. Johnnye Sima (ID). Recommends starting patient on 100 mg fluconazole qday as well as doxycycline. Given workup, if patient does not want to be admitted, it is reasonable to treat patient on an outpatinet basis. Encouraged follow up with ID as previously scheduled. Discussed patient with Dr. Gilford Raid who is agreeable.   Updated patient on ID plan.  He is agreeable.  I again offered admission but patient does not want to be admitted.  Vital stable at this time.  We will plan to discharge home with Doxy, fluconazole and Magic mouthwash.  Patient with history of allergies to Keflex but no other allergies noted.  Instructed him to keep his appointment with ID as previously scheduled. At this time, patient exhibits no emergent life-threatening condition that require further evaluation in ED or admission. Patient had ample opportunity for questions and discussion. All patient's questions were answered with full understanding. Strict return precautions discussed. Patient expresses understanding and agreement to plan.   Portions of this note were generated with Lobby[employer]. Dictation errors may occur despite best attempts at proofreading.   Final Clinical Impressions(s) / ED Diagnoses   Final  diagnoses:  Esophagitis  Community acquired pneumonia of left lower lobe of lung [facility])    ED Discharge Orders         Ordered    fluconazole (DIFLUCAN) 100 MG tablet  Daily     03/22/19 1507    magic mouthwash SOLN     03/22/19 1507    doxycycline (VIBRAMYCIN) 100 MG capsule  2 times daily     03/22/19 1507           Desma Mcgregor 03/22/19 1524    Isla Pence, MD 03/22/19 1904

## 2019-03-22 NOTE — ED Notes (Signed)
Pt returned from PE study at this time.

## 2019-03-22 NOTE — Discharge Instructions (Addendum)
Based on your lab work, elevated hear rate and possible infection I am sending you to the ER.  You need rehydration, imaging and possible IV antibiotics.

## 2019-03-22 NOTE — ED Triage Notes (Signed)
Pt sent by UC for eval of chest congestion, productive cough. Also tachycardic, 140 over at [facility]. Sob and cp with coughing

## 2019-03-25 LAB — LIPID PANEL
Cholesterol: 91 mg/dL (ref <200)
HDL: 32 mg/dL — ABNORMAL LOW (ref >=40)
LDL Cholesterol (Calc): 42 mg/dL (calc)
Non-HDL Cholesterol (Calc): 59 mg/dL (calc) (ref <130)
Total CHOL/HDL Ratio: 2.8 (calc) (ref <5.0)
Triglycerides: 83 mg/dL (ref <150)

## 2019-03-25 LAB — CBC WITH DIFFERENTIAL/PLATELET
Absolute Monocytes: 255 cells/uL (ref 200–950)
Basophils Absolute: 10 cells/uL (ref 0–200)
Basophils Relative: 0.4 %
Eosinophils Absolute: 10 cells/uL — ABNORMAL LOW (ref 15–500)
Eosinophils Relative: 0.4 %
HCT: 38.8 % (ref 38.5–50.0)
Hemoglobin: 13 g/dL — ABNORMAL LOW (ref 13.2–17.1)
Lymphs Abs: 493 cells/uL — ABNORMAL LOW (ref 850–3900)
MCH: 28.2 pg (ref 27.0–33.0)
MCHC: 33.5 g/dL (ref 32.0–36.0)
MCV: 84.2 fL (ref 80.0–100.0)
MPV: 12.4 fL (ref 7.5–12.5)
Monocytes Relative: 10.2 %
Neutro Abs: 1733 cells/uL (ref 1500–7800)
Neutrophils Relative %: 69.3 %
Platelets: 151 10*3/uL (ref 140–400)
RBC: 4.61 10*6/uL (ref 4.20–5.80)
RDW: 13.3 % (ref 11.0–15.0)
Total Lymphocyte: 19.7 %
WBC: 2.5 10*3/uL — ABNORMAL LOW (ref 3.8–10.8)

## 2019-03-25 LAB — COMPLETE METABOLIC PANEL WITH GFR
AG Ratio: 0.9 (calc) — ABNORMAL LOW (ref 1.0–2.5)
ALT: 14 U/L (ref 9–46)
AST: 12 U/L (ref 10–40)
Albumin: 3.7 g/dL (ref 3.6–5.1)
Alkaline phosphatase (APISO): 59 U/L (ref 36–130)
BUN: 9 mg/dL (ref 7–25)
CO2: 30 mmol/L (ref 20–32)
Calcium: 9.2 mg/dL (ref 8.6–10.3)
Chloride: 98 mmol/L (ref 98–110)
Creat: 0.82 mg/dL (ref 0.60–1.35)
GFR, Est African American: 128 mL/min/{1.73_m2} (ref >=60)
GFR, Est Non African American: 111 mL/min/{1.73_m2} (ref >=60)
Globulin: 4.2 g/dL (calc) — ABNORMAL HIGH (ref 1.9–3.7)
Glucose, Bld: 102 mg/dL — ABNORMAL HIGH (ref 65–99)
Potassium: 4.3 mmol/L (ref 3.5–5.3)
Sodium: 137 mmol/L (ref 135–146)
Total Bilirubin: 0.4 mg/dL (ref 0.2–1.2)
Total Protein: 7.9 g/dL (ref 6.1–8.1)

## 2019-03-25 LAB — FLUORESCENT TREPONEMAL AB(FTA)-IGG-BLD: Fluorescent Treponemal ABS: REACTIVE — AB

## 2019-03-25 LAB — HIV-1 RNA QUANT-NO REFLEX-BLD
HIV 1 RNA Quant: 351000 copies/mL — ABNORMAL HIGH
HIV-1 RNA Quant, Log: 5.55 Log copies/mL — ABNORMAL HIGH

## 2019-03-25 LAB — RPR: RPR Ser Ql: REACTIVE — AB

## 2019-03-25 LAB — RPR TITER: RPR Titer: 1:2 {titer} — ABNORMAL HIGH

## 2019-03-27 LAB — CULTURE, BLOOD (ROUTINE X 2)
Culture: NO GROWTH
Culture: NO GROWTH
Special Requests: ADEQUATE
Special Requests: ADEQUATE

## 2019-04-02 ENCOUNTER — Telehealth: Payer: Self-pay | Admitting: Family

## 2019-04-02 NOTE — Telephone Encounter (Signed)
COVID-19 Pre-Screening Questions:04/02/19   Do you currently have a fever (>100 F), chills or unexplained body aches? NO  Are you currently experiencing new cough, shortness of breath, sore throat, runny nose?NO   Have you recently travelled outside the state of [place] in the last 14 days? NO   Have you been in contact with someone that is currently pending confirmation of Covid19 testing or has been confirmed to have the [place] virus?  NO  **If the patient answers NO to ALL questions -  advise the patient to please call the clinic before coming to the office should any symptoms develop.

## 2019-04-03 ENCOUNTER — Other Ambulatory Visit: Payer: Self-pay

## 2019-04-03 ENCOUNTER — Encounter: Payer: Self-pay | Admitting: Family

## 2019-04-03 ENCOUNTER — Ambulatory Visit (INDEPENDENT_AMBULATORY_CARE_PROVIDER_SITE_OTHER): Payer: Self-pay | Admitting: Family

## 2019-04-03 VITALS — BP 108/74 | HR 101 | Temp 98.1°F | Wt 126.0 lb

## 2019-04-03 DIAGNOSIS — B20 Human immunodeficiency virus [HIV] disease: Secondary | ICD-10-CM | POA: Insufficient documentation

## 2019-04-03 NOTE — Patient Instructions (Signed)
Nice to meet you!  We will get you started on medication including Biktarvy, Bactrim and Azithromycin.  We will check your blood work today.  Plan for follow up in 1 month or sooner if needed.

## 2019-04-03 NOTE — Progress Notes (Signed)
Subjective:    Patient ID: Rickey Jones, male    DOB: 02-16-1976, 43 y.o.   MRN: [id number]  Chief Complaint  Patient presents with  . HIV Positive/AIDS    HPI:  Rickey Jones is a 43 y.o. male with HIV disease with previous wild type virus in 2015 who was last seen in the office on 12/19/14 for routine follow up with less than optimal adherence to his ART regimen of Stribild. At that time he had a vial load that was undetectable and CD4 count of 780. He has not been seen in the office since February of 2016. Most recent blood work completed on 03/20/19 with viral load of 351,000 and CD4 count <35. RPR was positive with titer of 1:2 with previous treatment at 1:32 in 2010. Renal function, hepatic function and electrolytes are within normal ranges. Cholesterol with LDL 42, HDL 32 and triglycerides of 83. Negative for gonorrhea and chlamydia. Healthcare maintenance due includes Prevnar, Pneumovax and Menveo.  Rickey Jones has not been taking his Stribild and has been out of care for the past 4 years. He has procrastinated getting back in care following missing one of the deadlines for applying for financial assistance. He did have depression about 1 month ago, but now is feeling better. Over the course of time he has lost close to 40 pounds and has also developed thrust that has been treated with Fluconazole and magic mouthwash. He is feeling okay today and eager to restart medications. Denies fevers, chills, night sweats, headaches, changes in vision, neck pain/stiffness, nausea, diarrhea, vomiting, lesions or rashes.  Rickey Jones is in the process of being approved for UMAP/ADAP and will be getting medications from [facility] once approved. He is currently working but on hiatus from [employer]. He does continue to smoke tobacco and denies any recreational or illicit drug use. No feelings of being down, depressed or hopeless at present. Not sexually active but does have a partner.     Allergies  Allergen Reactions  . Cephalexin Hives      Outpatient Medications Prior to Visit  Medication Sig Dispense Refill  . elvitegravir-cobicistat-emtricitabine-tenofovir (STRIBILD) 150-150-200-300 MG TABS tablet Take 1 tablet by mouth daily with breakfast. (Patient not taking: Reported on 04/03/2019) 30 tablet 11  . fluconazole (DIFLUCAN) 100 MG tablet Take 1 tablet (100 mg total) by mouth daily. (Patient not taking: Reported on 04/03/2019) 7 tablet 0  . magic mouthwash SOLN Take 2-3 ml three times a day before meals. Swish and spit. (Patient not taking: Reported on 04/03/2019) 15 mL 0   No facility-administered medications prior to visit.      Past Medical History:  Diagnosis Date  . HIV (human immunodeficiency virus infection) ([place])   . Shingles   . Thrush of mouth and esophagus ([place])       History reviewed. No pertinent surgical history.    History reviewed. No pertinent family history.    Social History   Socioeconomic History  . Marital status: Single    Spouse name: Not on file  . Number of children: Not on file  . Years of education: 44  . Highest education level: Not on file  Occupational History  . Not on file  Social Needs  . Financial resource strain: Not on file  . Food insecurity:    Worry: Not on file    Inability: Not on file  . Transportation needs:    Medical: Not on file    Non-medical: Not on file  Tobacco Use  . Smoking status: Current Every Day Smoker    Packs/day: 1.00    Years: 20.00    Pack years: 20.00    Types: Cigarettes  . Smokeless tobacco: Never Used  Substance and Sexual Activity  . Alcohol use: Yes    Alcohol/week: 1.0 standard drinks    Types: 1 Standard drinks or equivalent per week    Comment: occasional  . Drug use: No  . Sexual activity: Yes    Partners: Male    Birth control/protection: Condom    Comment: pt. declined condoms  Lifestyle  . Physical activity:    Days per week: Not on file    Minutes per  session: Not on file  . Stress: Not on file  Relationships  . Social connections:    Talks on phone: Not on file    Gets together: Not on file    Attends religious service: Not on file    Active member of club or organization: Not on file    Attends meetings of clubs or organizations: Not on file    Relationship status: Not on file  . Intimate partner violence:    Fear of current or ex partner: Not on file    Emotionally abused: Not on file    Physically abused: Not on file    Forced sexual activity: Not on file  Other Topics Concern  . Not on file  Social History Narrative  . Not on file      Review of Systems  Constitutional: Positive for unexpected weight change. Negative for appetite change, chills, fatigue and fever.  Eyes: Negative for visual disturbance.  Respiratory: Negative for cough, chest tightness, shortness of breath and wheezing.   Cardiovascular: Negative for chest pain and leg swelling.  Gastrointestinal: Negative for abdominal pain, constipation, diarrhea, nausea and vomiting.  Genitourinary: Negative for dysuria, flank pain, frequency, genital sores, hematuria and urgency.  Skin: Negative for rash.  Allergic/Immunologic: Negative for immunocompromised state.  Neurological: Negative for dizziness and headaches.       Objective:    BP 108/74   Pulse (!) 101   Temp 98.1 F (36.7 C)   Wt 126 lb (57.2 kg)   BMI 18.08 kg/m  Nursing note and vital signs reviewed.  Wt Readings from Last 3 Encounters:  04/03/19 126 lb (57.2 kg)  03/22/19 140 lb (63.5 kg)  12/19/14 167 lb (75.8 kg)    Physical Exam Constitutional:      General: He is not in acute distress.    Appearance: He is well-developed and underweight. He is ill-appearing.  Eyes:     Conjunctiva/sclera: Conjunctivae normal.  Neck:     Musculoskeletal: Neck supple.  Cardiovascular:     Rate and Rhythm: Normal rate and regular rhythm.     Heart sounds: Normal heart sounds. No murmur. No  friction rub. No gallop.   Pulmonary:     Effort: Pulmonary effort is normal. No respiratory distress.     Breath sounds: Normal breath sounds. No wheezing or rales.  Chest:     Chest wall: No tenderness.  Abdominal:     General: Bowel sounds are normal.     Palpations: Abdomen is soft.     Tenderness: There is no abdominal tenderness.  Lymphadenopathy:     Cervical: No cervical adenopathy.  Skin:    General: Skin is warm and dry.     Findings: No rash.  Neurological:     Mental Status: He is alert and  oriented to person, place, and time.  Psychiatric:        Behavior: Behavior normal.        Thought Content: Thought content normal.        Judgment: Judgment normal.         Assessment & Plan:   Problem List Items Addressed This Visit      Other   AIDS (acquired immune deficiency syndrome) ([place]) - Primary    Mr. Coba has poorly controlled HIV disease following being out of care for 4 years and has not taken his ART regimen of Stribild. We discussed the risks of continuing without medications and he is eager to restart. Fortunately he does not appear to have any opportunistic infection at present. Instructed that once approved for UMAP/ADAP will need to obtain medication from [facility]. UMAP/ADAP pending and will hopefully be available by Thursday at the latest. Recheck genotype today. Plan to start [place] and Bactrim. Follow up office visit in 1 month or sooner if needed with lab work at that time.       Relevant Orders   HIV-1 RNA ultraquant reflex to gentyp+       I have discontinued Rickey Jones's elvitegravir-cobicistat-emtricitabine-tenofovir, fluconazole, and magic mouthwash.   Follow-up: Return in about 1 month (around 05/03/2019), or if symptoms worsen or fail to improve.    Terri Piedra, MSN, FNP-C Nurse Practitioner [facility] for Infectious Disease [place] [facility] Office phone: [phone] Pager: [place] number:  [phone]

## 2019-04-03 NOTE — Assessment & Plan Note (Signed)
Mr. Beller has poorly controlled HIV disease following being out of care for 4 years and has not taken his ART regimen of Stribild. We discussed the risks of continuing without medications and he is eager to restart. Fortunately he does not appear to have any opportunistic infection at present. Instructed that once approved for UMAP/ADAP will need to obtain medication from [facility]. UMAP/ADAP pending and will hopefully be available by Thursday at the latest. Recheck genotype today. Plan to start [place] and Bactrim. Follow up office visit in 1 month or sooner if needed with lab work at that time.

## 2019-04-09 ENCOUNTER — Telehealth: Payer: Self-pay | Admitting: *Deleted

## 2019-04-09 DIAGNOSIS — B20 Human immunodeficiency virus [HIV] disease: Secondary | ICD-10-CM

## 2019-04-09 MED ORDER — BICTEGRAVIR-EMTRICITAB-TENOFOV 50-200-25 MG PO TABS: 1.0000 | ORAL_TABLET | Freq: Every day | ORAL | 2 refills | Status: DC

## 2019-04-09 MED ORDER — SULFAMETHOXAZOLE-TRIMETHOPRIM 800-160 MG PO TABS: 1.0000 | ORAL_TABLET | Freq: Every day | ORAL | 2 refills | Status: DC

## 2019-04-09 NOTE — Addendum Note (Signed)
Addended by: Reggy Eye on: 04/09/2019 05:06 PM   Modules accepted: Orders

## 2019-04-09 NOTE — Telephone Encounter (Signed)
Patient called to see why his medication has not been sent to the pharmacy. Advised his genotype is still pending but will ask the provider and someone will give him a call back.

## 2019-04-09 NOTE — Telephone Encounter (Signed)
Patient is ADAP and needed Rx sent to [facility]. Medications resent to correct pharmacy.

## 2019-04-09 NOTE — Telephone Encounter (Signed)
Medications have been sent to the pharmacy. I apologize for the delay.

## 2019-04-09 NOTE — Addendum Note (Signed)
Addended by: Mauricio Po D on: 04/09/2019 03:38 PM   Modules accepted: Orders

## 2019-04-10 ENCOUNTER — Other Ambulatory Visit: Payer: Self-pay | Admitting: Family

## 2019-04-10 ENCOUNTER — Telehealth: Payer: Self-pay

## 2019-04-10 MED ORDER — AZITHROMYCIN 600 MG PO TABS: 1200.0000 mg | ORAL_TABLET | ORAL | 0 refills | Status: DC

## 2019-04-10 NOTE — Telephone Encounter (Signed)
Patient called stating pharmacy did not have his azithromycin sent to pharmacy. Patient would like to have medication and per provider notes plan was to start azithromycin. LPN made provider aware and to send medicaiton to [facility] on [address].  LPN called patient back  to make him aware medication has been sent to pharmacy.  Eugenia Mcalpine, LPN

## 2019-04-22 LAB — HIV-1 GENOTYPE: HIV-1 Genotype: DETECTED — AB

## 2019-04-22 LAB — HIV-1 RNA ULTRAQUANT REFLEX TO GENTYP+
HIV 1 RNA Quant: 973000 copies/mL — ABNORMAL HIGH
HIV-1 RNA Quant, Log: 5.99 Log copies/mL — ABNORMAL HIGH

## 2019-05-02 ENCOUNTER — Telehealth: Payer: Self-pay | Admitting: Family

## 2019-05-02 NOTE — Telephone Encounter (Signed)
COVID-19 Pre-Screening Questions:05/02/19  Do you currently have a fever (>100 F), chills or unexplained body aches?NO  Are you currently experiencing new cough, shortness of breath, sore throat, runny nose? NO   Have you recently travelled outside the state of [place] in the last 14 days? NO   Have you been in contact with someone that is currently pending confirmation of Covid19 testing or has been confirmed to have the [place] virus?  NO  **If the patient answers NO to ALL questions -  advise the patient to please call the clinic before coming to the office should any symptoms develop.

## 2019-05-03 ENCOUNTER — Other Ambulatory Visit: Payer: Self-pay

## 2019-05-03 ENCOUNTER — Ambulatory Visit: Payer: Self-pay

## 2019-05-03 ENCOUNTER — Encounter: Payer: Self-pay | Admitting: Family

## 2019-05-03 ENCOUNTER — Ambulatory Visit (INDEPENDENT_AMBULATORY_CARE_PROVIDER_SITE_OTHER): Payer: Self-pay | Admitting: Family

## 2019-05-03 VITALS — BP 110/74 | HR 100 | Temp 98.6°F | Ht 70.0 in | Wt 138.0 lb

## 2019-05-03 DIAGNOSIS — Z0000 Encounter for general adult medical examination without abnormal findings: Secondary | ICD-10-CM

## 2019-05-03 DIAGNOSIS — B20 Human immunodeficiency virus [HIV] disease: Secondary | ICD-10-CM

## 2019-05-03 DIAGNOSIS — R21 Rash and other nonspecific skin eruption: Secondary | ICD-10-CM

## 2019-05-03 DIAGNOSIS — K143 Hypertrophy of tongue papillae: Secondary | ICD-10-CM | POA: Insufficient documentation

## 2019-05-03 MED ORDER — BICTEGRAVIR-EMTRICITAB-TENOFOV 50-200-25 MG PO TABS: 1.0000 | ORAL_TABLET | Freq: Every day | ORAL | 2 refills | Status: DC

## 2019-05-03 MED ORDER — FLUCONAZOLE 150 MG PO TABS: 150.0000 mg | ORAL_TABLET | Freq: Once | ORAL | 0 refills | Status: AC

## 2019-05-03 MED ORDER — SULFAMETHOXAZOLE-TRIMETHOPRIM 800-160 MG PO TABS: 1.0000 | ORAL_TABLET | Freq: Every day | ORAL | 2 refills | Status: DC

## 2019-05-03 NOTE — Patient Instructions (Signed)
Nice to see you.  We will check your blood work today and inform you of the results once available.  Continue to take your Biktarvy and Bactrim daily.  Take the fluconazole once.   Recommend moisturizing / cleansing skin with Dove, Aveeno or Eucerin products as these are the most moisturizing and natural.   We will check your blood work today.  Plan for follow up in 1 month or sooner if needed.   Have a great day and stay safe!

## 2019-05-03 NOTE — Assessment & Plan Note (Signed)
Greenish discoloration consistent with possible hairy tongue although cannot rule out thrush as Rickey Jones indicated his previous thrush started this way. Recommend conservative treatment with increased oral care. Will trial one dose of fluconazole. No indication at present for longer duration. Follow up if symptoms do not improve or worsen.

## 2019-05-03 NOTE — Assessment & Plan Note (Signed)
Facial rash of unclear origin. Appears like dermatitis. No changes in skin or laundry care products. Recommend moisturizing soap with Dove, Aveeno, or Eucerin type products. If symptoms do not improve with gentle washing/cleansing may need to refer to dermatology for additional evaluation.

## 2019-05-03 NOTE — Assessment & Plan Note (Signed)
   Declines immunizations today.  Discussed importance of safe sexual practice to reduce risk of acquisition/transmission of STI. Declines condoms.

## 2019-05-03 NOTE — Progress Notes (Signed)
Subjective:    Patient ID: Rickey Jones, male    DOB: [birth year], 43 y.o.   MRN: [id number]  Chief Complaint  Patient presents with  . HIV Positive/AIDS     HPI:  Rickey Jones is a 43 y.o. male with HIV/AIDS who was last seen in the office on 04/03/19 to reenter care. Viral load at the time was 351,000 with a CD4 count of <35. Genotype persomed on 04/03/19 with no significant resistance and wild type virus. Previous ART regimen was Stribild and was started on Biktarvy supplemented with Bactrim for OI prophylaxis.   Rickey Jones has been taking his Biktarvy and Bactrim as prescribed with no adverse side effects or missed doses. He is feeling good but does have a rash located on his face, had a recent shingles outbreak, on the left side of his abdomen and has cleared quickly, and concern for thrush or symptoms that feel like it. Denies fevers, chills, night sweats, headaches, changes in vision, neck pain/stiffness, nausea, diarrhea, vomiting, or lesions.  Rickey Jones is covered for medication assistance through UMAP/ADAP and has no problems obtaining his medication from the pharmacy. Denies feelings of being down, depressed or hopeless recently. Currently out of work. Denies recreational or illicit drug use, alcohol consumption and continues to smoke about 1 pack per day on average. Not currently sexually active.    Allergies  Allergen Reactions  . Cephalexin Hives     Outpatient Medications Prior to Visit  Medication Sig Dispense Refill  . valACYclovir (VALTREX) 1000 MG tablet TAKE 1 TABLET BY MOUTH THREE TIMES A DAY FOR 7 DAYS    . azithromycin (ZITHROMAX) 600 MG tablet Take 2 tablets (1,200 mg total) by mouth every 7 (seven) days. 2 tablet 0  . bictegravir-emtricitabine-tenofovir AF (BIKTARVY) 50-200-25 MG TABS tablet Take 1 tablet by mouth daily. 30 tablet 2  . sulfamethoxazole-trimethoprim (BACTRIM DS) 800-160 MG tablet Take 1 tablet by mouth daily. 30 tablet 2   No  facility-administered medications prior to visit.      Past Medical History:  Diagnosis Date  . HIV (human immunodeficiency virus infection) ([place])   . Shingles   . Thrush of mouth and esophagus ([place])      History reviewed. No pertinent surgical history.    Review of Systems  Constitutional: Negative for appetite change, chills, fatigue, fever and unexpected weight change.  Eyes: Negative for visual disturbance.  Respiratory: Negative for cough, chest tightness, shortness of breath and wheezing.   Cardiovascular: Negative for chest pain and leg swelling.  Gastrointestinal: Negative for abdominal pain, constipation, diarrhea, nausea and vomiting.  Genitourinary: Negative for dysuria, flank pain, frequency, genital sores, hematuria and urgency.  Skin: Positive for rash.  Allergic/Immunologic: Negative for immunocompromised state.  Neurological: Negative for dizziness and headaches.      Objective:    BP 110/74   Pulse 100   Temp 98.6 F (37 C) (Oral)   Ht 5\' 10"  (1.778 m)   Wt 138 lb (62.6 kg)   SpO2 99%   BMI 19.80 kg/m  Nursing note and vital signs reviewed.  Physical Exam Constitutional:      General: He is not in acute distress.    Appearance: He is well-developed.  HENT:     Head:     Comments: Facial rash around the nose, forehead and sides of his face appears red and splotchy. Appear with a shine of them. No discharge or drainage at present.     Mouth/Throat:  Comments: Greenish/darkish discoloration on top of tongue, appears like hairy tongue Eyes:     Conjunctiva/sclera: Conjunctivae normal.  Neck:     Musculoskeletal: Neck supple.  Cardiovascular:     Rate and Rhythm: Normal rate and regular rhythm.     Heart sounds: Normal heart sounds. No murmur. No friction rub. No gallop.   Pulmonary:     Effort: Pulmonary effort is normal. No respiratory distress.     Breath sounds: Normal breath sounds. No wheezing or rales.  Chest:     Chest wall: No  tenderness.  Abdominal:     General: Bowel sounds are normal.     Palpations: Abdomen is soft.     Tenderness: There is no abdominal tenderness.  Lymphadenopathy:     Cervical: No cervical adenopathy.  Skin:    General: Skin is warm and dry.     Findings: No rash.  Neurological:     Mental Status: He is alert and oriented to person, place, and time.  Psychiatric:        Behavior: Behavior normal.        Thought Content: Thought content normal.        Judgment: Judgment normal.        Assessment & Plan:   Problem List Items Addressed This Visit      Digestive   Tongue coating    Greenish discoloration consistent with possible hairy tongue although cannot rule out thrush as Rickey Jones indicated his previous thrush started this way. Recommend conservative treatment with increased oral care. Will trial one dose of fluconazole. No indication at present for longer duration. Follow up if symptoms do not improve or worsen.         Musculoskeletal and Integument   Facial rash    Facial rash of unclear origin. Appears like dermatitis. No changes in skin or laundry care products. Recommend moisturizing soap with Dove, Aveeno, or Eucerin type products. If symptoms do not improve with gentle washing/cleansing may need to refer to dermatology for additional evaluation.         Other   AIDS (acquired immune deficiency syndrome) ([place]) - Primary    Rickey Jones appears to be doing well with good adherence and tolerance to his ART regimen of Biktarvy supplemented with Bactrim for OI prophylaxis. Previous shingles outbreak may be related to decreased immune function although improved. Has no problems obtaining medication from the pharmacy. Renewed UMAP/ADAP today. Recheck blood work to check current status. Discussed continued need for Bactrim with low CD4 count. Continue current dose of Biktarvy and Bactrim. Follow up in 1 month or sooner if needed.      Relevant Medications    bictegravir-emtricitabine-tenofovir AF (BIKTARVY) 50-200-25 MG TABS tablet   sulfamethoxazole-trimethoprim (BACTRIM DS) 800-160 MG tablet   fluconazole (DIFLUCAN) 150 MG tablet   valACYclovir (VALTREX) 1000 MG tablet   Other Relevant Orders   COMPLETE METABOLIC PANEL WITH GFR   T-helper cell (CD4)- ([facility] clinic only)   HIV-1 RNA quant-no reflex-bld   CBC w/Diff   Healthcare maintenance     Declines immunizations today.  Discussed importance of safe sexual practice to reduce risk of acquisition/transmission of STI. Declines condoms.           I have discontinued Jhair Wion's azithromycin. I am also having him start on fluconazole. Additionally, I am having him maintain his bictegravir-emtricitabine-tenofovir AF, sulfamethoxazole-trimethoprim, and valACYclovir.   Meds ordered this encounter  Medications  . bictegravir-emtricitabine-tenofovir AF (BIKTARVY) 50-200-25 MG TABS tablet  Sig: Take 1 tablet by mouth daily.    Dispense:  30 tablet    Refill:  2    Order Specific Question:   Supervising Provider    Answer:   Carlyle Basques [[id number]]  . sulfamethoxazole-trimethoprim (BACTRIM DS) 800-160 MG tablet    Sig: Take 1 tablet by mouth daily.    Dispense:  30 tablet    Refill:  2    Order Specific Question:   Supervising Provider    Answer:   Carlyle Basques [[id number]]  . fluconazole (DIFLUCAN) 150 MG tablet    Sig: Take 1 tablet (150 mg total) by mouth once for 1 dose.    Dispense:  1 tablet    Refill:  0    Order Specific Question:   Supervising Provider    Answer:   Carlyle Basques [[id number]]     Follow-up: Return in about 1 month (around 06/03/2019), or if symptoms worsen or fail to improve.   Terri Piedra, MSN, FNP-C Nurse Practitioner [facility] [place] number: [phone]

## 2019-05-03 NOTE — Assessment & Plan Note (Signed)
Mr. Meckel appears to be doing well with good adherence and tolerance to his ART regimen of Biktarvy supplemented with Bactrim for OI prophylaxis. Previous shingles outbreak may be related to decreased immune function although improved. Has no problems obtaining medication from the pharmacy. Renewed UMAP/ADAP today. Recheck blood work to check current status. Discussed continued need for Bactrim with low CD4 count. Continue current dose of Biktarvy and Bactrim. Follow up in 1 month or sooner if needed.

## 2019-05-04 LAB — T-HELPER CELL (CD4) - (RCID CLINIC ONLY)
CD4 % Helper T Cell: 13 % — ABNORMAL LOW (ref 33–65)
CD4 T Cell Abs: 84 /uL — ABNORMAL LOW (ref 400–1790)

## 2019-05-09 LAB — CBC WITH DIFFERENTIAL/PLATELET
Absolute Monocytes: 386 cells/uL (ref 200–950)
Basophils Absolute: 21 cells/uL (ref 0–200)
Basophils Relative: 0.9 %
Eosinophils Absolute: 71 cells/uL (ref 15–500)
Eosinophils Relative: 3.1 %
HCT: 40.2 % (ref 38.5–50.0)
Hemoglobin: 13.5 g/dL (ref 13.2–17.1)
Lymphs Abs: 713 cells/uL — ABNORMAL LOW (ref 850–3900)
MCH: 30.5 pg (ref 27.0–33.0)
MCHC: 33.6 g/dL (ref 32.0–36.0)
MCV: 90.7 fL (ref 80.0–100.0)
MPV: 10.3 fL (ref 7.5–12.5)
Monocytes Relative: 16.8 %
Neutro Abs: 1109 cells/uL — ABNORMAL LOW (ref 1500–7800)
Neutrophils Relative %: 48.2 %
Platelets: 173 10*3/uL (ref 140–400)
RBC: 4.43 10*6/uL (ref 4.20–5.80)
RDW: 17.3 % — ABNORMAL HIGH (ref 11.0–15.0)
Total Lymphocyte: 31 %
WBC: 2.3 10*3/uL — ABNORMAL LOW (ref 3.8–10.8)

## 2019-05-09 LAB — COMPLETE METABOLIC PANEL WITH GFR
AG Ratio: 1.4 (calc) (ref 1.0–2.5)
ALT: 29 U/L (ref 9–46)
AST: 20 U/L (ref 10–40)
Albumin: 4.2 g/dL (ref 3.6–5.1)
Alkaline phosphatase (APISO): 69 U/L (ref 36–130)
BUN: 7 mg/dL (ref 7–25)
CO2: 28 mmol/L (ref 20–32)
Calcium: 9.6 mg/dL (ref 8.6–10.3)
Chloride: 104 mmol/L (ref 98–110)
Creat: 1.04 mg/dL (ref 0.60–1.35)
GFR, Est African American: 104 mL/min/{1.73_m2} (ref >=60)
GFR, Est Non African American: 89 mL/min/{1.73_m2} (ref >=60)
Globulin: 3.1 g/dL (calc) (ref 1.9–3.7)
Glucose, Bld: 98 mg/dL (ref 65–99)
Potassium: 4.8 mmol/L (ref 3.5–5.3)
Sodium: 139 mmol/L (ref 135–146)
Total Bilirubin: 0.3 mg/dL (ref 0.2–1.2)
Total Protein: 7.3 g/dL (ref 6.1–8.1)

## 2019-05-09 LAB — HIV-1 RNA QUANT-NO REFLEX-BLD
HIV 1 RNA Quant: 517 copies/mL — ABNORMAL HIGH
HIV-1 RNA Quant, Log: 2.71 Log copies/mL — ABNORMAL HIGH

## 2019-05-10 ENCOUNTER — Encounter: Payer: Self-pay | Admitting: Family

## 2019-05-31 ENCOUNTER — Telehealth: Payer: Self-pay | Admitting: Family

## 2019-05-31 NOTE — Telephone Encounter (Signed)
COVID-19 Pre-Screening Questions: ° °Do you currently have a fever (>100 °F), chills or unexplained body aches? N ° °Are you currently experiencing new cough, shortness of breath, sore throat, runny nose? N °•  °Have you recently travelled outside the state of [place] in the last 14 days? N  °•  °Have you been in contact with someone that is currently pending confirmation of Covid19 testing or has been confirmed to have the Covid19 virus?  N ° °**If the patient answers NO to ALL questions -  advise the patient to please call the clinic before coming to the office should any symptoms develop.  ° ° ° °

## 2019-06-04 ENCOUNTER — Other Ambulatory Visit: Payer: Self-pay

## 2019-06-04 ENCOUNTER — Ambulatory Visit (INDEPENDENT_AMBULATORY_CARE_PROVIDER_SITE_OTHER): Payer: Self-pay | Admitting: Family

## 2019-06-04 ENCOUNTER — Encounter: Payer: Self-pay | Admitting: Family

## 2019-06-04 VITALS — BP 104/73 | HR 81 | Temp 98.5°F | Wt 130.0 lb

## 2019-06-04 DIAGNOSIS — Z0000 Encounter for general adult medical examination without abnormal findings: Secondary | ICD-10-CM

## 2019-06-04 DIAGNOSIS — B20 Human immunodeficiency virus [HIV] disease: Secondary | ICD-10-CM

## 2019-06-04 DIAGNOSIS — F17200 Nicotine dependence, unspecified, uncomplicated: Secondary | ICD-10-CM

## 2019-06-04 MED ORDER — BICTEGRAVIR-EMTRICITAB-TENOFOV 50-200-25 MG PO TABS: 1.0000 | ORAL_TABLET | Freq: Every day | ORAL | 2 refills | Status: DC

## 2019-06-04 MED ORDER — SULFAMETHOXAZOLE-TRIMETHOPRIM 800-160 MG PO TABS: 1.0000 | ORAL_TABLET | Freq: Every day | ORAL | 2 refills | Status: DC

## 2019-06-04 NOTE — Assessment & Plan Note (Signed)
   Referral placed to [place] clinic for routine dental care  Discussed importance of safe sexual practice to reduce risk of acquisition/transmission of STI.  Condoms provided.  Consider immunizations at next office visit.

## 2019-06-04 NOTE — Assessment & Plan Note (Signed)
Rickey Jones appears improved with good adherence and tolerance to his ART regimen of Biktarvy supplemented with Bactrim for OI prophylaxis.  He has no signs/symptoms of opportunistic infection or progressive HIV disease at present.  Discussed importance of continues take medication and plan for discontinuation of Bactrim if CD4 count is greater than 200 for 3 months or viral load is undetectable and CD4 count greater than 100 for 3 to 6 months.  Continue current dose of Biktarvy supplemented with Bactrim for OI prophylaxis.  Check blood work today.  Plan for follow-up in 2 months or sooner if needed with lab work 1 to 2 weeks prior to appointment or on the same day.

## 2019-06-04 NOTE — Patient Instructions (Signed)
Nice to see you.  Continue to take your Biktarvy and Bactrim as prescribed.   We will check your blood work today.  Refills of medication have been sent to the pharmacy.  Recommend a flu shot when available in 1 month.  Plan for follow up in 2 months or sooner if needed with lab work 1-2 weeks prior to appointment or on same day.  Have a great day and stay safe!

## 2019-06-04 NOTE — Progress Notes (Signed)
Subjective:    Patient ID: Rickey Jones, male    DOB: November 26, 1975, 43 y.o.   MRN: [id number]  Chief Complaint  Patient presents with  . HIV Positive/AIDS     HPI:  Rickey Jones is a 43 y.o. male with AIDS who was last seen in the office on 05/03/2019 with good adherence and tolerance to his ART regimen of Biktarvy supplemented with Bactrim for OI prophylaxis.  Viral load at the time was found to be 517 down from 973,000 with CD4 count of 84.  Rickey Jones continues to take his Biktarvy and Bactrim as prescribed no adverse side effects or missed doses.  Overall feeling well today with no new concerns/complaints.  Previous concern for oral infection improved with increased dental care and rash located on face has improved with changing of skin products. Denies fevers, chills, night sweats, headaches, changes in vision, neck pain/stiffness, nausea, diarrhea, vomiting, lesions or rashes.  Rickey Jones remains covered through UMAP/ADAP and has no problems obtaining his medication from the pharmacy.  He is up-to-date through 2021.  Denies feelings of being down, depressed, or hopeless recently.  Continues to work full-time at [employer].  No recreational or illicit drug use.  He does smoke tobacco at a rate of approximately 1 pack/day.  Alcohol use is occasional.  No new sexual partners and is not currently sexually active.  He is due for dental screening.   Allergies  Allergen Reactions  . Cephalexin Hives      Outpatient Medications Prior to Visit  Medication Sig Dispense Refill  . bictegravir-emtricitabine-tenofovir AF (BIKTARVY) 50-200-25 MG TABS tablet Take 1 tablet by mouth daily. 30 tablet 2  . sulfamethoxazole-trimethoprim (BACTRIM DS) 800-160 MG tablet Take 1 tablet by mouth daily. 30 tablet 2  . valACYclovir (VALTREX) 1000 MG tablet TAKE 1 TABLET BY MOUTH THREE TIMES A DAY FOR 7 DAYS     No facility-administered medications prior to visit.      Past Medical History:   Diagnosis Date  . HIV (human immunodeficiency virus infection) ([place])   . Shingles   . Thrush of mouth and esophagus ([place])      History reviewed. No pertinent surgical history.     Review of Systems  Constitutional: Negative for appetite change, chills, fatigue, fever and unexpected weight change.  Eyes: Negative for visual disturbance.  Respiratory: Negative for cough, chest tightness, shortness of breath and wheezing.   Cardiovascular: Negative for chest pain and leg swelling.  Gastrointestinal: Negative for abdominal pain, constipation, diarrhea, nausea and vomiting.  Genitourinary: Negative for dysuria, flank pain, frequency, genital sores, hematuria and urgency.  Skin: Negative for rash.  Allergic/Immunologic: Negative for immunocompromised state.  Neurological: Negative for dizziness and headaches.      Objective:    BP 104/73   Pulse 81   Temp 98.5 F (36.9 C) (Oral)   Wt 130 lb (59 kg)   BMI 18.65 kg/m  Nursing note and vital signs reviewed.  Physical Exam Constitutional:      General: He is not in acute distress.    Appearance: He is well-developed.  Eyes:     Conjunctiva/sclera: Conjunctivae normal.  Neck:     Musculoskeletal: Neck supple.  Cardiovascular:     Rate and Rhythm: Normal rate and regular rhythm.     Heart sounds: Normal heart sounds. No murmur. No friction rub. No gallop.   Pulmonary:     Effort: Pulmonary effort is normal. No respiratory distress.     Breath sounds:  Normal breath sounds. No wheezing or rales.  Chest:     Chest wall: No tenderness.  Abdominal:     General: Bowel sounds are normal.     Palpations: Abdomen is soft.     Tenderness: There is no abdominal tenderness.  Lymphadenopathy:     Cervical: No cervical adenopathy.  Skin:    General: Skin is warm and dry.     Findings: No rash.  Neurological:     Mental Status: He is alert and oriented to person, place, and time.  Psychiatric:        Behavior: Behavior normal.         Thought Content: Thought content normal.        Judgment: Judgment normal.      Depression screen [facility] 2/9 06/04/2019 05/03/2019 12/19/2014 09/03/2014 08/06/2013  Decreased Interest 0 0 0 0 0  Down, Depressed, Hopeless 0 1 0 0 0  PHQ - 2 Score 0 1 0 0 0       Assessment & Plan:   Problem List Items Addressed This Visit      Other   TOBACCO USER    Rickey Jones continues to smoke approximately 1 pack of cigarettes per day.  Discussed importance of tobacco cessation to reduce risk of cardiovascular, respiratory, malignant disease in the future.  He is in the precontemplation stage of quitting and not ready to quit at this time.  Continue to monitor.      AIDS (acquired immune deficiency syndrome) ([place]) - Primary    Rickey Jones appears improved with good adherence and tolerance to his ART regimen of Biktarvy supplemented with Bactrim for OI prophylaxis.  He has no signs/symptoms of opportunistic infection or progressive HIV disease at present.  Discussed importance of continues take medication and plan for discontinuation of Bactrim if CD4 count is greater than 200 for 3 months or viral load is undetectable and CD4 count greater than 100 for 3 to 6 months.  Continue current dose of Biktarvy supplemented with Bactrim for OI prophylaxis.  Check blood work today.  Plan for follow-up in 2 months or sooner if needed with lab work 1 to 2 weeks prior to appointment or on the same day.      Relevant Medications   bictegravir-emtricitabine-tenofovir AF (BIKTARVY) 50-200-25 MG TABS tablet   sulfamethoxazole-trimethoprim (BACTRIM DS) 800-160 MG tablet   Other Relevant Orders   COMPLETE METABOLIC PANEL WITH GFR   HIV-1 RNA quant-no reflex-bld   T-helper cell (CD4)- ([facility] clinic only)   Healthcare maintenance     Referral placed to [facility] for routine dental care  Discussed importance of safe sexual practice to reduce risk of acquisition/transmission of STI.  Condoms provided.  Consider  immunizations at next office visit.          I am having Rickey Jones maintain his valACYclovir, bictegravir-emtricitabine-tenofovir AF, and sulfamethoxazole-trimethoprim.   Meds ordered this encounter  Medications  . bictegravir-emtricitabine-tenofovir AF (BIKTARVY) 50-200-25 MG TABS tablet    Sig: Take 1 tablet by mouth daily.    Dispense:  30 tablet    Refill:  2    Order Specific Question:   Supervising Provider    Answer:   Carlyle Basques [[id number]]  . sulfamethoxazole-trimethoprim (BACTRIM DS) 800-160 MG tablet    Sig: Take 1 tablet by mouth daily.    Dispense:  30 tablet    Refill:  2    Order Specific Question:   Supervising Provider    Answer:  Carlyle Basques [[id number]]     Follow-up: Return in about 2 months (around 08/04/2019), or if symptoms worsen or fail to improve.   Terri Piedra, MSN, FNP-C Nurse Practitioner [facility] for Infectious Disease [place] number: [phone]

## 2019-06-04 NOTE — Assessment & Plan Note (Signed)
Rickey Jones continues to smoke approximately 1 pack of cigarettes per day.  Discussed importance of tobacco cessation to reduce risk of cardiovascular, respiratory, malignant disease in the future.  He is in the precontemplation stage of quitting and not ready to quit at this time.  Continue to monitor.

## 2019-06-05 LAB — T-HELPER CELL (CD4) - (RCID CLINIC ONLY)
CD4 % Helper T Cell: 12 % — ABNORMAL LOW (ref 33–65)
CD4 T Cell Abs: 138 /uL — ABNORMAL LOW (ref 400–1790)

## 2019-06-07 LAB — COMPLETE METABOLIC PANEL WITH GFR
AG Ratio: 1.6 (calc) (ref 1.0–2.5)
ALT: 15 U/L (ref 9–46)
AST: 14 U/L (ref 10–40)
Albumin: 4.6 g/dL (ref 3.6–5.1)
Alkaline phosphatase (APISO): 66 U/L (ref 36–130)
BUN: 10 mg/dL (ref 7–25)
CO2: 30 mmol/L (ref 20–32)
Calcium: 10 mg/dL (ref 8.6–10.3)
Chloride: 104 mmol/L (ref 98–110)
Creat: 0.97 mg/dL (ref 0.60–1.35)
GFR, Est African American: 113 mL/min/{1.73_m2} (ref >=60)
GFR, Est Non African American: 97 mL/min/{1.73_m2} (ref >=60)
Globulin: 2.8 g/dL (calc) (ref 1.9–3.7)
Glucose, Bld: 90 mg/dL (ref 65–99)
Potassium: 4.4 mmol/L (ref 3.5–5.3)
Sodium: 139 mmol/L (ref 135–146)
Total Bilirubin: 0.3 mg/dL (ref 0.2–1.2)
Total Protein: 7.4 g/dL (ref 6.1–8.1)

## 2019-06-07 LAB — HIV-1 RNA QUANT-NO REFLEX-BLD
HIV 1 RNA Quant: 171 copies/mL — ABNORMAL HIGH
HIV-1 RNA Quant, Log: 2.23 Log copies/mL — ABNORMAL HIGH

## 2019-07-19 ENCOUNTER — Other Ambulatory Visit: Payer: Self-pay

## 2019-07-19 DIAGNOSIS — Z79899 Other long term (current) drug therapy: Secondary | ICD-10-CM

## 2019-07-19 DIAGNOSIS — B20 Human immunodeficiency virus [HIV] disease: Secondary | ICD-10-CM

## 2019-07-20 LAB — T-HELPER CELL (CD4) - (RCID CLINIC ONLY)
CD4 % Helper T Cell: 11 % — ABNORMAL LOW (ref 33–65)
CD4 T Cell Abs: 126 /uL — ABNORMAL LOW (ref 400–1790)

## 2019-07-25 LAB — HIV-1 RNA QUANT-NO REFLEX-BLD
HIV 1 RNA Quant: 83 copies/mL — ABNORMAL HIGH
HIV-1 RNA Quant, Log: 1.92 Log copies/mL — ABNORMAL HIGH

## 2019-07-25 LAB — LIPID PANEL
Cholesterol: 115 mg/dL (ref <200)
HDL: 37 mg/dL — ABNORMAL LOW (ref >=40)
LDL Cholesterol (Calc): 64 mg/dL (calc)
Non-HDL Cholesterol (Calc): 78 mg/dL (calc) (ref <130)
Total CHOL/HDL Ratio: 3.1 (calc) (ref <5.0)
Triglycerides: 60 mg/dL (ref <150)

## 2019-07-25 LAB — CBC WITH DIFFERENTIAL/PLATELET
Absolute Monocytes: 289 cells/uL (ref 200–950)
Basophils Absolute: 10 cells/uL (ref 0–200)
Basophils Relative: 0.3 %
Eosinophils Absolute: 10 cells/uL — ABNORMAL LOW (ref 15–500)
Eosinophils Relative: 0.3 %
HCT: 44.3 % (ref 38.5–50.0)
Hemoglobin: 15.1 g/dL (ref 13.2–17.1)
Lymphs Abs: 1139 cells/uL (ref 850–3900)
MCH: 30.9 pg (ref 27.0–33.0)
MCHC: 34.1 g/dL (ref 32.0–36.0)
MCV: 90.6 fL (ref 80.0–100.0)
MPV: 10.7 fL (ref 7.5–12.5)
Monocytes Relative: 8.5 %
Neutro Abs: 1952 cells/uL (ref 1500–7800)
Neutrophils Relative %: 57.4 %
Platelets: 162 10*3/uL (ref 140–400)
RBC: 4.89 10*6/uL (ref 4.20–5.80)
RDW: 13.5 % (ref 11.0–15.0)
Total Lymphocyte: 33.5 %
WBC: 3.4 10*3/uL — ABNORMAL LOW (ref 3.8–10.8)

## 2019-07-25 LAB — COMPREHENSIVE METABOLIC PANEL
AG Ratio: 2 (calc) (ref 1.0–2.5)
ALT: 13 U/L (ref 9–46)
AST: 14 U/L (ref 10–40)
Albumin: 4.3 g/dL (ref 3.6–5.1)
Alkaline phosphatase (APISO): 59 U/L (ref 36–130)
BUN: 11 mg/dL (ref 7–25)
CO2: 27 mmol/L (ref 20–32)
Calcium: 9.5 mg/dL (ref 8.6–10.3)
Chloride: 106 mmol/L (ref 98–110)
Creat: 1.02 mg/dL (ref 0.60–1.35)
Globulin: 2.2 g/dL (calc) (ref 1.9–3.7)
Glucose, Bld: 89 mg/dL (ref 65–99)
Potassium: 5 mmol/L (ref 3.5–5.3)
Sodium: 140 mmol/L (ref 135–146)
Total Bilirubin: 0.3 mg/dL (ref 0.2–1.2)
Total Protein: 6.5 g/dL (ref 6.1–8.1)

## 2019-08-02 ENCOUNTER — Other Ambulatory Visit: Payer: Self-pay

## 2019-08-02 ENCOUNTER — Encounter: Payer: Self-pay | Admitting: Family

## 2019-08-02 ENCOUNTER — Ambulatory Visit (INDEPENDENT_AMBULATORY_CARE_PROVIDER_SITE_OTHER): Payer: Self-pay | Admitting: Family

## 2019-08-02 VITALS — BP 116/83 | HR 89 | Temp 98.5°F

## 2019-08-02 DIAGNOSIS — Z0000 Encounter for general adult medical examination without abnormal findings: Secondary | ICD-10-CM

## 2019-08-02 DIAGNOSIS — B20 Human immunodeficiency virus [HIV] disease: Secondary | ICD-10-CM

## 2019-08-02 DIAGNOSIS — F17200 Nicotine dependence, unspecified, uncomplicated: Secondary | ICD-10-CM

## 2019-08-02 DIAGNOSIS — Z113 Encounter for screening for infections with a predominantly sexual mode of transmission: Secondary | ICD-10-CM | POA: Insufficient documentation

## 2019-08-02 MED ORDER — BICTEGRAVIR-EMTRICITAB-TENOFOV 50-200-25 MG PO TABS: 1.0000 | ORAL_TABLET | Freq: Every day | ORAL | 5 refills | Status: DC

## 2019-08-02 MED ORDER — SULFAMETHOXAZOLE-TRIMETHOPRIM 800-160 MG PO TABS: 1.0000 | ORAL_TABLET | Freq: Every day | ORAL | 2 refills | Status: DC

## 2019-08-02 NOTE — Progress Notes (Signed)
Subjective:    Patient ID: Rickey Jones, male    DOB: Jan 31, 1976, 43 y.o.   MRN: [id number]  Chief Complaint  Patient presents with  . Follow-up    curious about test results     HPI:  Rickey Jones is a 43 y.o. male with AIDS who was last seen in the office on 06/04/19 for follow up and had improved adherence and tolerance to his ART regimen of Biktarvy supplemented with Bactrim for OI prophylaxis. Viral load at the time was 171 with CD4 count of 138. Most recent blood work completed on 07/19/19 with CD4 count of 126 and viral load down to 83. Healthcare maintenance due includes Prevnar, influenza and Menveo vaccinations.   Rickey Jones continues to take his Biktarvy as prescribed with no adverse side effects or missed doses.  Overall feeling well today with no new concerns and has several questions regarding his lab work.Denies fevers, chills, night sweats, headaches, changes in vision, neck pain/stiffness, nausea, diarrhea, vomiting, lesions or rashes.  Rickey Jones continues to receive financial assistance through [facility] and has no problems obtaining his medication from the pharmacy.  Denies feelings of being down, depressed, or hopeless recently.  He continues to work full-time through [employer].  His job has been stressful at times and he does have concerns about working with children periodically.  No recreational or illicit drug use or alcohol consumption.  Smokes approximately 1 pack of cigarettes per day on average.  He has a new partner and is interested in being tested for STI.   Allergies  Allergen Reactions  . Cephalexin Hives      Outpatient Medications Prior to Visit  Medication Sig Dispense Refill  . bictegravir-emtricitabine-tenofovir AF (BIKTARVY) 50-200-25 MG TABS tablet Take 1 tablet by mouth daily. 30 tablet 2  . sulfamethoxazole-trimethoprim (BACTRIM DS) 800-160 MG tablet Take 1 tablet by mouth daily. 30 tablet 2  . fluconazole (DIFLUCAN) 150 MG tablet      . valACYclovir (VALTREX) 1000 MG tablet TAKE 1 TABLET BY MOUTH THREE TIMES A DAY FOR 7 DAYS     No facility-administered medications prior to visit.      Past Medical History:  Diagnosis Date  . HIV (human immunodeficiency virus infection) ([place])   . Shingles   . Thrush of mouth and esophagus ([place])     History reviewed. No pertinent surgical history.   Review of Systems  Constitutional: Negative for appetite change, chills, fatigue, fever and unexpected weight change.  Eyes: Negative for visual disturbance.  Respiratory: Negative for cough, chest tightness, shortness of breath and wheezing.   Cardiovascular: Negative for chest pain and leg swelling.  Gastrointestinal: Negative for abdominal pain, constipation, diarrhea, nausea and vomiting.  Genitourinary: Negative for dysuria, flank pain, frequency, genital sores, hematuria and urgency.  Skin: Negative for rash.  Allergic/Immunologic: Negative for immunocompromised state.  Neurological: Negative for dizziness and headaches.      Objective:    BP 116/83   Pulse 89   Temp 98.5 F (36.9 C) (Oral)   SpO2 98%  Nursing note and vital signs reviewed.  Physical Exam Constitutional:      General: He is not in acute distress.    Appearance: He is well-developed.  Eyes:     Conjunctiva/sclera: Conjunctivae normal.  Neck:     Musculoskeletal: Neck supple.  Cardiovascular:     Rate and Rhythm: Normal rate and regular rhythm.     Heart sounds: Normal heart sounds. No murmur. No friction rub.  No gallop.   Pulmonary:     Effort: Pulmonary effort is normal. No respiratory distress.     Breath sounds: Normal breath sounds. No wheezing or rales.  Chest:     Chest wall: No tenderness.  Abdominal:     General: Bowel sounds are normal.     Palpations: Abdomen is soft.     Tenderness: There is no abdominal tenderness.  Lymphadenopathy:     Cervical: No cervical adenopathy.  Skin:    General: Skin is warm and dry.      Findings: No rash.  Neurological:     Mental Status: He is alert and oriented to person, place, and time.  Psychiatric:        Behavior: Behavior normal.        Thought Content: Thought content normal.        Judgment: Judgment normal.       Depression screen [facility] 2/9 08/02/2019 06/04/2019 05/03/2019 12/19/2014 09/03/2014  Decreased Interest 0 0 0 0 0  Down, Depressed, Hopeless 0 0 1 0 0  PHQ - 2 Score 0 0 1 0 0       Assessment & Plan:    Patient Active Problem List   Diagnosis Date Noted  . Screening for STDs (sexually transmitted diseases) 08/02/2019  . Facial rash 05/03/2019  . Healthcare maintenance 05/03/2019  . Tongue coating 05/03/2019  . AIDS (acquired immune deficiency syndrome) ([place]) 04/03/2019  . RASH AND OTHER NONSPECIFIC SKIN ERUPTION 09/26/2007  . PENILE LESION 08/28/2007  . COUGH 08/28/2007  . TOBACCO USER 01/24/2007  . 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 HEPATITIS B, HX OF 12/26/2006  . HIV DISEASE 11/25/2004     Problem List Items Addressed This Visit      Other   TOBACCO USER    Rickey Jones continues to smoke approximately 1 pack of cigarettes per day on average.  Discussed importance of tobacco cessation to reduce risk of cardiovascular, respiratory, malignant disease in the future.  He is in the precontemplation stage of quitting with no desire to quit at the present time.  Continue to monitor.      AIDS (acquired immune deficiency syndrome) ([place]) - Primary    Rickey Jones continues to see improvement although has low level viremia with viral load of 83.  CD4 count fluctuating and he believes he was not feeling well the day his labs were drawn.  No signs/symptoms of opportunistic infection or progressive HIV disease.  We discussed his lab work results in detail and that it will likely take a longer period of time for his numbers to continually improve.  Check for STI today.  Continue current dose of Biktarvy and Bactrim.  We also discussed possible  discontinuation of Bactrim in the next couple months as long as he remains undetectable depending upon his CD4 count.  Plan for follow-up in 2 months or sooner if needed with lab work 1 to 2 weeks prior to appointment.      Relevant Medications   bictegravir-emtricitabine-tenofovir AF (BIKTARVY) 50-200-25 MG TABS tablet   sulfamethoxazole-trimethoprim (BACTRIM DS) 800-160 MG tablet   Other Relevant Orders   Comprehensive metabolic panel   T-helper cell (CD4)- ([facility] clinic only)   HIV-1 RNA quant-no reflex-bld   Healthcare maintenance     Declines immunizations today.  Due for Menveo, influenza, and Prevnar.   Awaiting dental referral for routine dental care  Discussed importance of safe sexual practice to reduce risk of STI.  Declines condoms.  Screening for STDs (sexually transmitted diseases)    Mr. Raimondo has a new partner and has had unprotected sex.  Discussed importance of routine STI testing.  Check oral, rectal, and urine lab work for gonorrhea/chlamydia.  Will test RPR at next blood work.  Condoms encouraged.      Relevant Orders   Urine cytology ancillary only([place])   Cytology (oral, anal, urethral) ancillary only   Cytology (oral, anal, urethral) ancillary only   RPR       I am having Andre Lefort maintain his valACYclovir, fluconazole, bictegravir-emtricitabine-tenofovir AF, and sulfamethoxazole-trimethoprim.   Meds ordered this encounter  Medications  . bictegravir-emtricitabine-tenofovir AF (BIKTARVY) 50-200-25 MG TABS tablet    Sig: Take 1 tablet by mouth daily.    Dispense:  30 tablet    Refill:  5    Order Specific Question:   Supervising Provider    Answer:   Carlyle Basques [[id number]]  . sulfamethoxazole-trimethoprim (BACTRIM DS) 800-160 MG tablet    Sig: Take 1 tablet by mouth daily.    Dispense:  30 tablet    Refill:  2    Order Specific Question:   Supervising Provider    Answer:   Carlyle Basques [[id number]]     Follow-up: Return in about  2 months (around 10/02/2019), or if symptoms worsen or fail to improve.   Terri Piedra, MSN, FNP-C Nurse Practitioner [facility] for Infectious Disease [place] number: [phone]

## 2019-08-02 NOTE — Assessment & Plan Note (Signed)
Rickey Jones continues to see improvement although has low level viremia with viral load of 83.  CD4 count fluctuating and he believes he was not feeling well the day his labs were drawn.  No signs/symptoms of opportunistic infection or progressive HIV disease.  We discussed his lab work results in detail and that it will likely take a longer period of time for his numbers to continually improve.  Check for STI today.  Continue current dose of Biktarvy and Bactrim.  We also discussed possible discontinuation of Bactrim in the next couple months as long as he remains undetectable depending upon his CD4 count.  Plan for follow-up in 2 months or sooner if needed with lab work 1 to 2 weeks prior to appointment.

## 2019-08-02 NOTE — Assessment & Plan Note (Signed)
Rickey Jones continues to smoke approximately 1 pack of cigarettes per day on average.  Discussed importance of tobacco cessation to reduce risk of cardiovascular, respiratory, malignant disease in the future.  He is in the precontemplation stage of quitting with no desire to quit at the present time.  Continue to monitor.

## 2019-08-02 NOTE — Assessment & Plan Note (Signed)
   Declines immunizations today.  Due for Menveo, influenza, and Prevnar.   Awaiting dental referral for routine dental care  Discussed importance of safe sexual practice to reduce risk of STI.  Declines condoms.

## 2019-08-02 NOTE — Patient Instructions (Addendum)
Nice to see you.  Please continue to take your Biktarvy and Bactrim as prescribed.  Refills will be sent to the pharmacy.  Plan for follow up in 2 months or sooner if needed with lab work 1-2 weeks prior to appointment.   Have a great day and stay safe!

## 2019-08-02 NOTE — Assessment & Plan Note (Signed)
Mr. Garate has a new partner and has had unprotected sex.  Discussed importance of routine STI testing.  Check oral, rectal, and urine lab work for gonorrhea/chlamydia.  Will test RPR at next blood work.  Condoms encouraged.

## 2019-08-10 LAB — URINE CYTOLOGY ANCILLARY ONLY
Chlamydia: NEGATIVE
Comment: NEGATIVE
Comment: NORMAL
Neisseria Gonorrhea: NEGATIVE

## 2019-08-10 LAB — CYTOLOGY, (ORAL, ANAL, URETHRAL) ANCILLARY ONLY
Chlamydia: NEGATIVE
Chlamydia: NEGATIVE
Comment: NEGATIVE
Comment: NEGATIVE
Comment: NORMAL
Comment: NORMAL
Neisseria Gonorrhea: NEGATIVE
Neisseria Gonorrhea: NEGATIVE

## 2019-09-14 ENCOUNTER — Other Ambulatory Visit: Payer: Self-pay

## 2019-09-14 DIAGNOSIS — Z113 Encounter for screening for infections with a predominantly sexual mode of transmission: Secondary | ICD-10-CM

## 2019-09-14 DIAGNOSIS — B20 Human immunodeficiency virus [HIV] disease: Secondary | ICD-10-CM

## 2019-09-14 LAB — T-HELPER CELL (CD4) - (RCID CLINIC ONLY)
CD4 % Helper T Cell: 15 % — ABNORMAL LOW (ref 33–65)
CD4 T Cell Abs: 138 /uL — ABNORMAL LOW (ref 400–1790)

## 2019-09-21 LAB — COMPREHENSIVE METABOLIC PANEL
AG Ratio: 1.8 (calc) (ref 1.0–2.5)
ALT: 19 U/L (ref 9–46)
AST: 16 U/L (ref 10–40)
Albumin: 4.7 g/dL (ref 3.6–5.1)
Alkaline phosphatase (APISO): 61 U/L (ref 36–130)
BUN: 11 mg/dL (ref 7–25)
CO2: 24 mmol/L (ref 20–32)
Calcium: 9.5 mg/dL (ref 8.6–10.3)
Chloride: 101 mmol/L (ref 98–110)
Creat: 1.15 mg/dL (ref 0.60–1.35)
Globulin: 2.6 g/dL (calc) (ref 1.9–3.7)
Glucose, Bld: 101 mg/dL — ABNORMAL HIGH (ref 65–99)
Potassium: 4.7 mmol/L (ref 3.5–5.3)
Sodium: 139 mmol/L (ref 135–146)
Total Bilirubin: 0.6 mg/dL (ref 0.2–1.2)
Total Protein: 7.3 g/dL (ref 6.1–8.1)

## 2019-09-21 LAB — HIV-1 RNA QUANT-NO REFLEX-BLD
HIV 1 RNA Quant: 38 copies/mL — ABNORMAL HIGH
HIV-1 RNA Quant, Log: 1.58 Log copies/mL — ABNORMAL HIGH

## 2019-09-21 LAB — FLUORESCENT TREPONEMAL AB(FTA)-IGG-BLD: Fluorescent Treponemal ABS: REACTIVE — AB

## 2019-09-21 LAB — RPR TITER: RPR Titer: 1:2 {titer} — ABNORMAL HIGH

## 2019-09-21 LAB — RPR: RPR Ser Ql: REACTIVE — AB

## 2019-09-28 ENCOUNTER — Ambulatory Visit (INDEPENDENT_AMBULATORY_CARE_PROVIDER_SITE_OTHER): Payer: Self-pay | Admitting: Family

## 2019-09-28 ENCOUNTER — Encounter: Payer: Self-pay | Admitting: Family

## 2019-09-28 ENCOUNTER — Other Ambulatory Visit: Payer: Self-pay

## 2019-09-28 DIAGNOSIS — Z0000 Encounter for general adult medical examination without abnormal findings: Secondary | ICD-10-CM

## 2019-09-28 DIAGNOSIS — B20 Human immunodeficiency virus [HIV] disease: Secondary | ICD-10-CM

## 2019-09-28 MED ORDER — SULFAMETHOXAZOLE-TRIMETHOPRIM 800-160 MG PO TABS: 1.0000 | ORAL_TABLET | Freq: Every day | ORAL | 2 refills | Status: DC

## 2019-09-28 MED ORDER — BICTEGRAVIR-EMTRICITAB-TENOFOV 50-200-25 MG PO TABS: 1.0000 | ORAL_TABLET | Freq: Every day | ORAL | 5 refills | Status: DC

## 2019-09-28 NOTE — Assessment & Plan Note (Signed)
Rickey Jones appears to be doing well with good adherence and tolerance to his ART regimen of Biktarvy supplemented with Bactrim for OI prophylaxis.  No signs/symptoms of opportunistic infection or progressive HIV disease at present.  He still does remain at risk for opportunistic infection with a CD4 count of 138 and viral load now undetectable for the first time in several months.  Discussed importance of continue take medication as prescribed.  Reviewed lab work and discussed plan of care.  Continue current dose of Biktarvy supplemented with Bactrim for OI prophylaxis.  Plan for follow-up in 2 months or sooner if needed.

## 2019-09-28 NOTE — Progress Notes (Signed)
Subjective:    Patient ID: Rickey Jones, male    DOB: 26-Feb-1976, 43 y.o.   MRN: [id number]  Chief Complaint  Patient presents with  . Follow-up    offered condoms; declined flu shot with RN; no other complaints     HPI:  Rickey Jones is a 43 y.o. male with HIV/AIDS who was last seen in the office on 08/02/2019 with good adherence and tolerance to his ART regimen of Biktarvy supplemented with Bactrim for OI prophylaxis.  Viral load at the time was found to be 83 with CD4 count of 126.  Most recent blood work completed on 09/14/2019 shows a CD4 count of 138 with a viral load of 38 and undetectable.  Kidney function, liver function, and electrolytes within normal ranges.  RPR titer remains 1: 2 and serofast.  Healthcare maintenance due includes Prevnar, Pneumovax, influenza vaccination, and Menveo.  Rickey Jones continues to take his Biktarvy and Bactrim as prescribed with no adverse side effects or missed doses since his last office visit.  Having signs/symptoms of a sinus infection today which are improving slowly otherwise doing well. Denies fevers, chills, night sweats, headaches, changes in vision, neck pain/stiffness, nausea, diarrhea, vomiting, lesions or rashes.  Rickey Jones has no problems obtaining his medication from the pharmacy.  Denies feelings of being down, depressed, or hopeless recently.  He continues to work full-time at [employer].  No recreational or illicit drug use, tobacco use, or alcohol consumption at present.  Does remain sexually active but uses condoms when he is.  Allergies  Allergen Reactions  . Cephalexin Hives    Outpatient Medications Prior to Visit  Medication Sig Dispense Refill  . bictegravir-emtricitabine-tenofovir AF (BIKTARVY) 50-200-25 MG TABS tablet Take 1 tablet by mouth daily. 30 tablet 5  . fluconazole (DIFLUCAN) 150 MG tablet     . valACYclovir (VALTREX) 1000 MG tablet TAKE 1 TABLET BY MOUTH THREE TIMES A DAY  FOR 7 DAYS    . sulfamethoxazole-trimethoprim (BACTRIM DS) 800-160 MG tablet Take 1 tablet by mouth daily. 30 tablet 2   No facility-administered medications prior to visit.      Past Medical History:  Diagnosis Date  . HIV (human immunodeficiency virus infection) ([place])   . Shingles   . Thrush of mouth and esophagus ([place])      History reviewed. No pertinent surgical history.  Review of Systems  Constitutional: Negative for appetite change, chills, fatigue, fever and unexpected weight change.  Eyes: Negative for visual disturbance.  Respiratory: Negative for cough, chest tightness, shortness of breath and wheezing.   Cardiovascular: Negative for chest pain and leg swelling.  Gastrointestinal: Negative for abdominal pain, constipation, diarrhea, nausea and vomiting.  Genitourinary: Negative for dysuria, flank pain, frequency, genital sores, hematuria and urgency.  Skin: Negative for rash.  Allergic/Immunologic: Negative for immunocompromised state.  Neurological: Negative for dizziness and headaches.      Objective:    BP 112/76   Pulse 68   Temp 97.9 F (36.6 C) (Oral)   Wt 150 lb (68 kg)   BMI 21.52 kg/m  Nursing note and vital signs reviewed.  Physical Exam Constitutional:      General: He is not in acute distress.    Appearance: He is well-developed.  Eyes:     Conjunctiva/sclera: Conjunctivae normal.  Neck:     Musculoskeletal: Neck supple.  Cardiovascular:     Rate and Rhythm: Normal rate and regular rhythm.     Heart sounds: Normal  heart sounds. No murmur. No friction rub. No gallop.   Pulmonary:     Effort: Pulmonary effort is normal. No respiratory distress.     Breath sounds: Normal breath sounds. No wheezing or rales.  Chest:     Chest wall: No tenderness.  Abdominal:     General: Bowel sounds are normal.     Palpations: Abdomen is soft.     Tenderness: There is no abdominal tenderness.  Lymphadenopathy:     Cervical: No cervical adenopathy.   Skin:    General: Skin is warm and dry.     Findings: No rash.  Neurological:     Mental Status: He is alert and oriented to person, place, and time.  Psychiatric:        Behavior: Behavior normal.        Thought Content: Thought content normal.        Judgment: Judgment normal.      Depression screen [facility] 2/9 09/28/2019 08/02/2019 06/04/2019 05/03/2019 12/19/2014  Decreased Interest 0 0 0 0 0  Down, Depressed, Hopeless 0 0 0 1 0  PHQ - 2 Score 0 0 0 1 0       Assessment & Plan:    Patient Active Problem List   Diagnosis Date Noted  . Screening for STDs (sexually transmitted diseases) 08/02/2019  . Facial rash 05/03/2019  . Healthcare maintenance 05/03/2019  . Tongue coating 05/03/2019  . AIDS (acquired immune deficiency syndrome) ([place]) 04/03/2019  . RASH AND OTHER NONSPECIFIC SKIN ERUPTION 09/26/2007  . PENILE LESION 08/28/2007  . COUGH 08/28/2007  . TOBACCO USER 01/24/2007  . 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 HEPATITIS B, HX OF 12/26/2006  . HIV DISEASE 11/25/2004     Problem List Items Addressed This Visit      Other   AIDS (acquired immune deficiency syndrome) [facility])    Rickey Jones appears to be doing well with good adherence and tolerance to his ART regimen of Biktarvy supplemented with Bactrim for OI prophylaxis.  No signs/symptoms of opportunistic infection or progressive HIV disease at present.  He still does remain at risk for opportunistic infection with a CD4 count of 138 and viral load now undetectable for the first time in several months.  Discussed importance of continue take medication as prescribed.  Reviewed lab work and discussed plan of care.  Continue current dose of Biktarvy supplemented with Bactrim for OI prophylaxis.  Plan for follow-up in 2 months or sooner if needed.      Relevant Medications   sulfamethoxazole-trimethoprim (BACTRIM DS) 800-160 MG tablet   bictegravir-emtricitabine-tenofovir AF (BIKTARVY) 50-200-25 MG TABS tablet   Other Relevant  Orders   3 month T-helper cell (CD4)- ([facility] clinic only)   3 month CBC   3 month CMP   3 month VL   Healthcare maintenance     Declines vaccinations today.  Discussed importance of safe sexual practice to reduce risk of STI.  Condoms provided.          I am having Rickey Jones maintain his valACYclovir, fluconazole, sulfamethoxazole-trimethoprim, and bictegravir-emtricitabine-tenofovir AF.   Meds ordered this encounter  Medications  . sulfamethoxazole-trimethoprim (BACTRIM DS) 800-160 MG tablet    Sig: Take 1 tablet by mouth daily.    Dispense:  30 tablet    Refill:  2    Order Specific Question:   Supervising Provider    Answer:   Carlyle Basques [[id number]]  . bictegravir-emtricitabine-tenofovir AF (BIKTARVY) 50-200-25 MG TABS tablet    Sig: Take 1  tablet by mouth daily.    Dispense:  30 tablet    Refill:  5    Order Specific Question:   Supervising Provider    Answer:   Carlyle Basques [[id number]]     Follow-up: Return in about 2 months (around 11/29/2019).   Terri Piedra, MSN, FNP-C Nurse Practitioner [facility] for Infectious Disease [place] number: [phone]

## 2019-09-28 NOTE — Assessment & Plan Note (Signed)
   Declines vaccinations today.  Discussed importance of safe sexual practice to reduce risk of STI.  Condoms provided.

## 2019-09-28 NOTE — Patient Instructions (Addendum)
Nice to see you.  Continue to take your Biktarvy and Bactrim as prescribed.  Refills will be sent to the pharmacy.   Plan for follow up in 2 months or sooner if need with lab work 1-2 weeks prior to appointment as needed.   Have a great day and stay safe!  Happy Holidays!

## 2019-11-30 ENCOUNTER — Other Ambulatory Visit: Payer: Self-pay

## 2019-11-30 DIAGNOSIS — B20 Human immunodeficiency virus [HIV] disease: Secondary | ICD-10-CM

## 2019-11-30 LAB — T-HELPER CELL (CD4) - (RCID CLINIC ONLY)
CD4 % Helper T Cell: 14 % — ABNORMAL LOW (ref 33–65)
CD4 T Cell Abs: 174 /uL — ABNORMAL LOW (ref 400–1790)

## 2019-12-04 LAB — CBC
HCT: 46.5 % (ref 38.5–50.0)
Hemoglobin: 16.4 g/dL (ref 13.2–17.1)
MCH: 32.8 pg (ref 27.0–33.0)
MCHC: 35.3 g/dL (ref 32.0–36.0)
MCV: 93 fL (ref 80.0–100.0)
MPV: 10.7 fL (ref 7.5–12.5)
Platelets: 169 10*3/uL (ref 140–400)
RBC: 5 10*6/uL (ref 4.20–5.80)
RDW: 12.1 % (ref 11.0–15.0)
WBC: 3.8 10*3/uL (ref 3.8–10.8)

## 2019-12-04 LAB — COMPREHENSIVE METABOLIC PANEL
AG Ratio: 2.1 (calc) (ref 1.0–2.5)
ALT: 16 U/L (ref 9–46)
AST: 13 U/L (ref 10–40)
Albumin: 4.4 g/dL (ref 3.6–5.1)
Alkaline phosphatase (APISO): 66 U/L (ref 36–130)
BUN: 11 mg/dL (ref 7–25)
CO2: 30 mmol/L (ref 20–32)
Calcium: 9.3 mg/dL (ref 8.6–10.3)
Chloride: 105 mmol/L (ref 98–110)
Creat: 1.07 mg/dL (ref 0.60–1.35)
Globulin: 2.1 g/dL (calc) (ref 1.9–3.7)
Glucose, Bld: 115 mg/dL — ABNORMAL HIGH (ref 65–99)
Potassium: 4.8 mmol/L (ref 3.5–5.3)
Sodium: 140 mmol/L (ref 135–146)
Total Bilirubin: 0.3 mg/dL (ref 0.2–1.2)
Total Protein: 6.5 g/dL (ref 6.1–8.1)

## 2019-12-04 LAB — HIV-1 RNA QUANT-NO REFLEX-BLD
HIV 1 RNA Quant: 79 copies/mL — ABNORMAL HIGH
HIV-1 RNA Quant, Log: 1.9 Log copies/mL — ABNORMAL HIGH

## 2019-12-21 ENCOUNTER — Ambulatory Visit (INDEPENDENT_AMBULATORY_CARE_PROVIDER_SITE_OTHER): Payer: Self-pay | Admitting: Family

## 2019-12-21 ENCOUNTER — Other Ambulatory Visit: Payer: Self-pay

## 2019-12-21 ENCOUNTER — Encounter: Payer: Self-pay | Admitting: Family

## 2019-12-21 VITALS — BP 117/76 | HR 75 | Temp 98.3°F | Wt 150.0 lb

## 2019-12-21 DIAGNOSIS — Z0000 Encounter for general adult medical examination without abnormal findings: Secondary | ICD-10-CM

## 2019-12-21 DIAGNOSIS — B20 Human immunodeficiency virus [HIV] disease: Secondary | ICD-10-CM

## 2019-12-21 MED ORDER — BICTEGRAVIR-EMTRICITAB-TENOFOV 50-200-25 MG PO TABS: 1.0000 | ORAL_TABLET | Freq: Every day | ORAL | 5 refills | Status: DC

## 2019-12-21 MED ORDER — SULFAMETHOXAZOLE-TRIMETHOPRIM 800-160 MG PO TABS: 1.0000 | ORAL_TABLET | Freq: Every day | ORAL | 2 refills | Status: DC

## 2019-12-21 NOTE — Patient Instructions (Addendum)
Nice to see you.  Continue to take your [name] daily as prescribed.  Continue the Bactrim as prescribed.  Refills have been sent to the pharmacy.  Plan for follow up in 3 months or sooner if needed with lab work 1-2 weeks prior to appointment.   Have a great day and stay safe!

## 2019-12-21 NOTE — Assessment & Plan Note (Signed)
Rickey Jones continues to have adequately controlled HIV/AIDS with good adherence and tolerance to his ART regimen of Biktarvy supplemented with Bactrim for OI prophylaxis.  No signs/symptoms of opportunistic infection or progressive HIV disease.  We reviewed lab work and discussed plan of care.  I think it is reasonable to consider stopping Bactrim in the near future given his continued suppression with Biktarvy and increasing CD4 count.  He wishes to continue for now with the Bactrim which I think is reasonable.  Continue current dose of Biktarvy supplemented with Bactrim for OI prophylaxis.  Plan for follow-up in 3 months or sooner if needed with lab work 1 to 2 weeks prior to appointment or on the same day.

## 2019-12-21 NOTE — Progress Notes (Signed)
Subjective:    Patient ID: Rickey Jones, male    DOB: 1976/07/07, 44 y.o.   MRN: [id number]  Chief Complaint  Patient presents with  . Follow-up     HPI:  Rickey Jones is a 44 y.o. male with HIV/AIDS who was last seen in the office on 09/28/2019 with good adherence and tolerance to his ART regimen of Biktarvy supplemented with Bactrim for OI prophylaxis.  Viral load at the time was 38 with CD4 count of 138.  Most recent blood work completed on 11/30/2019 with viral load of 79 and CD4 count of 174.  Kidney function, liver function, and electrolytes within normal ranges.  Healthcare maintenance due includes Prevnar, Pneumovax, Menveo, and influenza vaccinations.  [name] continues to take his Biktarvy and Bactrim as prescribed with no adverse side effects or missed doses.  Overall feeling well today with no new concerns/complaints. Denies fevers, chills, night sweats, headaches, changes in vision, neck pain/stiffness, nausea, diarrhea, vomiting, lesions or rashes.  [name] has no problems obtaining his medication from the pharmacy and remains covered through [employer] which has been renewed on 11/23/2019.  Denies feelings of being down, depressed, or hopeless recently.  No recreational or illicit drug use, tobacco use, or alcohol consumption.  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  Declines condoms today.   Allergies  Allergen Reactions  . Cephalexin Hives     Outpatient Medications Prior to Visit  Medication Sig Dispense Refill  . bictegravir-emtricitabine-tenofovir AF (BIKTARVY) 50-200-25 MG TABS tablet Take 1 tablet by mouth daily. 30 tablet 5  . sulfamethoxazole-trimethoprim (BACTRIM DS) 800-160 MG tablet Take 1 tablet by mouth daily. 30 tablet 2  . fluconazole (DIFLUCAN) 150 MG tablet     . valACYclovir (VALTREX) 1000 MG tablet TAKE 1 TABLET BY MOUTH THREE TIMES A DAY FOR 7 DAYS     No facility-administered medications prior to visit.      Past Medical History:  Diagnosis Date  . HIV (human immunodeficiency virus infection) ([name])   . Shingles   . Thrush of mouth and esophagus ([name])      History reviewed. No pertinent surgical history.     Review of Systems  Constitutional: Negative for appetite change, chills, fatigue, fever and unexpected weight change.  Eyes: Negative for visual disturbance.  Respiratory: Negative for cough, chest tightness, shortness of breath and wheezing.   Cardiovascular: Negative for chest pain and leg swelling.  Gastrointestinal: Negative for abdominal pain, constipation, diarrhea, nausea and vomiting.  Genitourinary: Negative for dysuria, flank pain, frequency, genital sores, hematuria and urgency.  Skin: Negative for rash.  Allergic/Immunologic: Negative for immunocompromised state.  Neurological: Negative for dizziness and headaches.      Objective:    BP 117/76   Pulse 75   Temp 98.3 F (36.8 C) (Oral)   Wt 150 lb (68 kg)   BMI 21.52 kg/m  Nursing note and vital signs reviewed.  Physical Exam Constitutional:      General: He is not in acute distress.    Appearance: He is well-developed.  Eyes:     Conjunctiva/sclera: Conjunctivae normal.  Cardiovascular:     Rate and Rhythm: Normal rate and regular rhythm.     Heart sounds: Normal heart sounds. No murmur. No friction rub. No gallop.   Pulmonary:     Effort: Pulmonary effort is normal. No respiratory distress.     Breath sounds: Normal breath sounds. No wheezing or rales.  Chest:  Chest wall: No tenderness.  Abdominal:     General: Bowel sounds are normal.     Palpations: Abdomen is soft.     Tenderness: There is no abdominal tenderness.  Musculoskeletal:     Cervical back: Neck supple.  Lymphadenopathy:     Cervical: No cervical adenopathy.  Skin:    General: Skin is warm and dry.     Findings: No rash.  Neurological:     Mental Status: He is alert and oriented to person, place, and time.  Psychiatric:         Behavior: Behavior normal.        Thought Content: Thought content normal.        Judgment: Judgment normal.      Depression screen [facility] 2/9 09/28/2019 08/02/2019 06/04/2019 05/03/2019 12/19/2014  Decreased Interest 0 0 0 0 0  Down, Depressed, Hopeless 0 0 0 1 0  PHQ - 2 Score 0 0 0 1 0       Assessment & Plan:    Patient Active Problem List   Diagnosis Date Noted  . Screening for STDs (sexually transmitted diseases) 08/02/2019  . Facial rash 05/03/2019  . Healthcare maintenance 05/03/2019  . Tongue coating 05/03/2019  . AIDS (acquired immune deficiency syndrome) ([name]) 04/03/2019  . RASH AND OTHER NONSPECIFIC SKIN ERUPTION 09/26/2007  . PENILE LESION 08/28/2007  . COUGH 08/28/2007  . TOBACCO USER 01/24/2007  . 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 HEPATITIS B, HX OF 12/26/2006  . HIV DISEASE 11/25/2004     Problem List Items Addressed This Visit      Other   AIDS (acquired immune deficiency syndrome) [facility])    Mr. [name] continues to have adequately controlled HIV/AIDS with good adherence and tolerance to his ART regimen of Biktarvy supplemented with Bactrim for OI prophylaxis.  No signs/symptoms of opportunistic infection or progressive HIV disease.  We reviewed lab work and discussed plan of care.  I think it is reasonable to consider stopping Bactrim in the near future given his continued suppression with Biktarvy and increasing CD4 count.  He wishes to continue for now with the Bactrim which I think is reasonable.  Continue current dose of Biktarvy supplemented with Bactrim for OI prophylaxis.  Plan for follow-up in 3 months or sooner if needed with lab work 1 to 2 weeks prior to appointment or on the same day.      Relevant Medications   bictegravir-emtricitabine-tenofovir AF (BIKTARVY) 50-200-25 MG TABS tablet   sulfamethoxazole-trimethoprim (BACTRIM DS) 800-160 MG tablet   Other Relevant Orders   HIV-1 RNA quant-no reflex-bld   T-helper cell (CD4)- ([facility] clinic only)    Comprehensive metabolic panel   Healthcare maintenance     Declines vaccinations today.  Discussed importance of safe sexual practice to reduce risk of STI.  Condoms declined          I am having Windle Goncalves maintain his valACYclovir, fluconazole, bictegravir-emtricitabine-tenofovir AF, and sulfamethoxazole-trimethoprim.   Meds ordered this encounter  Medications  . bictegravir-emtricitabine-tenofovir AF (BIKTARVY) 50-200-25 MG TABS tablet    Sig: Take 1 tablet by mouth daily.    Dispense:  30 tablet    Refill:  5    Order Specific Question:   Supervising Provider    Answer:   Carlyle Basques [[id number]]  . sulfamethoxazole-trimethoprim (BACTRIM DS) 800-160 MG tablet    Sig: Take 1 tablet by mouth daily.    Dispense:  30 tablet    Refill:  2    Order Specific  Question:   Supervising Provider    Answer:   Carlyle Basques [[id number]]     Follow-up: Return in about 3 months (around 03/19/2020).   Terri Piedra, MSN, FNP-C Nurse Practitioner [facility] for Infectious Disease [place] number: [phone]

## 2019-12-21 NOTE — Assessment & Plan Note (Signed)
   Declines vaccinations today.  Discussed importance of safe sexual practice to reduce risk of STI.  Condoms declined

## 2020-02-29 ENCOUNTER — Other Ambulatory Visit: Payer: Self-pay

## 2020-02-29 DIAGNOSIS — B20 Human immunodeficiency virus [HIV] disease: Secondary | ICD-10-CM

## 2020-02-29 LAB — T-HELPER CELL (CD4) - (RCID CLINIC ONLY)
CD4 % Helper T Cell: 15 % — ABNORMAL LOW (ref 33–65)
CD4 T Cell Abs: 205 /uL — ABNORMAL LOW (ref 400–1790)

## 2020-03-04 LAB — COMPREHENSIVE METABOLIC PANEL
AG Ratio: 2 (calc) (ref 1.0–2.5)
ALT: 16 U/L (ref 9–46)
AST: 14 U/L (ref 10–40)
Albumin: 4.5 g/dL (ref 3.6–5.1)
Alkaline phosphatase (APISO): 61 U/L (ref 36–130)
BUN: 10 mg/dL (ref 7–25)
CO2: 27 mmol/L (ref 20–32)
Calcium: 9.6 mg/dL (ref 8.6–10.3)
Chloride: 105 mmol/L (ref 98–110)
Creat: 1.01 mg/dL (ref 0.60–1.35)
Globulin: 2.2 g/dL (calc) (ref 1.9–3.7)
Glucose, Bld: 80 mg/dL (ref 65–99)
Potassium: 4.6 mmol/L (ref 3.5–5.3)
Sodium: 140 mmol/L (ref 135–146)
Total Bilirubin: 0.3 mg/dL (ref 0.2–1.2)
Total Protein: 6.7 g/dL (ref 6.1–8.1)

## 2020-03-04 LAB — HIV-1 RNA QUANT-NO REFLEX-BLD
HIV 1 RNA Quant: 74 copies/mL — ABNORMAL HIGH
HIV-1 RNA Quant, Log: 1.87 Log copies/mL — ABNORMAL HIGH

## 2020-03-14 ENCOUNTER — Encounter: Payer: Self-pay | Admitting: Family

## 2020-03-14 ENCOUNTER — Ambulatory Visit (INDEPENDENT_AMBULATORY_CARE_PROVIDER_SITE_OTHER): Payer: Self-pay | Admitting: Family

## 2020-03-14 ENCOUNTER — Other Ambulatory Visit: Payer: Self-pay

## 2020-03-14 VITALS — BP 128/78 | HR 80 | Temp 98.7°F | Wt 157.0 lb

## 2020-03-14 DIAGNOSIS — Z0000 Encounter for general adult medical examination without abnormal findings: Secondary | ICD-10-CM

## 2020-03-14 DIAGNOSIS — B20 Human immunodeficiency virus [HIV] disease: Secondary | ICD-10-CM

## 2020-03-14 NOTE — Progress Notes (Signed)
Subjective:    Patient ID: Rickey Jones, male    DOB: 28-Dec-1975, 44 y.o.   MRN: [id number]  No chief complaint on file.    HPI:  Rickey Jones is a 44 y.o. male with HIV/AIDS last seen in the office on 12/21/2019 with good adherence and tolerance to his ART regimen of Biktarvy supplemented with Bactrim for OI prophylaxis.  Viral load at the time was 79 with a CD4 count of 174.  Most recent blood work completed on 02/29/2020 with a viral load of 74 and CD4 count of 205.  Rickey Jones continues to take his Biktarvy as prescribed with no adverse side effects or missed doses since his last office visit.  He has subsequently stopped his Bactrim about 1 week ago per our previous discussion.  Overall feeling well today with no new concerns/complaints. Denies fevers, chills, night sweats, headaches, changes in vision, neck pain/stiffness, nausea, diarrhea, vomiting, lesions or rashes.  Rickey Jones has no problems obtaining his medication from the pharmacy and remains covered through [employer].  Denies feelings of being down, depressed, or hopeless recently.  Smokes marijuana and consumes alcohol on occasion.  Current every day smoker at an average rate of approximately 1 pack/day.  Working full-time at [employer].   Allergies  Allergen Reactions  . Cephalexin Hives      Outpatient Medications Prior to Visit  Medication Sig Dispense Refill  . bictegravir-emtricitabine-tenofovir AF (BIKTARVY) 50-200-25 MG TABS tablet Take 1 tablet by mouth daily. 30 tablet 5  . sulfamethoxazole-trimethoprim (BACTRIM DS) 800-160 MG tablet Take 1 tablet by mouth daily. 30 tablet 2  . fluconazole (DIFLUCAN) 150 MG tablet     . valACYclovir (VALTREX) 1000 MG tablet TAKE 1 TABLET BY MOUTH THREE TIMES A DAY FOR 7 DAYS     No facility-administered medications prior to visit.     Past Medical History:  Diagnosis Date  . HIV (human immunodeficiency virus infection) ([place])   . Shingles   . Thrush of  mouth and esophagus ([place])      History reviewed. No pertinent surgical history.     Review of Systems  Constitutional: Negative for appetite change, chills, fatigue, fever and unexpected weight change.  Eyes: Negative for visual disturbance.  Respiratory: Negative for cough, chest tightness, shortness of breath and wheezing.   Cardiovascular: Negative for chest pain and leg swelling.  Gastrointestinal: Negative for abdominal pain, constipation, diarrhea, nausea and vomiting.  Genitourinary: Negative for dysuria, flank pain, frequency, genital sores, hematuria and urgency.  Skin: Negative for rash.  Allergic/Immunologic: Negative for immunocompromised state.  Neurological: Negative for dizziness and headaches.      Objective:    BP 128/78   Pulse 80   Temp 98.7 F (37.1 C) (Oral)   Wt 157 lb (71.2 kg)   BMI 22.53 kg/m  Nursing note and vital signs reviewed.  Physical Exam Constitutional:      General: He is not in acute distress.    Appearance: He is well-developed.  Eyes:     Conjunctiva/sclera: Conjunctivae normal.  Cardiovascular:     Rate and Rhythm: Normal rate and regular rhythm.     Heart sounds: Normal heart sounds. No murmur. No friction rub. No gallop.   Pulmonary:     Effort: Pulmonary effort is normal. No respiratory distress.     Breath sounds: Normal breath sounds. No wheezing or rales.  Chest:     Chest wall: No tenderness.  Abdominal:     General: Bowel sounds  are normal.     Palpations: Abdomen is soft.     Tenderness: There is no abdominal tenderness.  Musculoskeletal:     Cervical back: Neck supple.  Lymphadenopathy:     Cervical: No cervical adenopathy.  Skin:    General: Skin is warm and dry.     Findings: No rash.  Neurological:     Mental Status: He is alert and oriented to person, place, and time.  Psychiatric:        Behavior: Behavior normal.        Thought Content: Thought content normal.        Judgment: Judgment normal.       Depression screen [facility] 2/9 09/28/2019 08/02/2019 06/04/2019 05/03/2019 12/19/2014  Decreased Interest 0 0 0 0 0  Down, Depressed, Hopeless 0 0 0 1 0  PHQ - 2 Score 0 0 0 1 0       Assessment & Plan:    Patient Active Problem List   Diagnosis Date Noted  . Screening for STDs (sexually transmitted diseases) 08/02/2019  . Facial rash 05/03/2019  . Healthcare maintenance 05/03/2019  . Tongue coating 05/03/2019  . AIDS (acquired immune deficiency syndrome) ([place]) 04/03/2019  . RASH AND OTHER NONSPECIFIC SKIN ERUPTION 09/26/2007  . PENILE LESION 08/28/2007  . COUGH 08/28/2007  . TOBACCO USER 01/24/2007  . 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 HEPATITIS B, HX OF 12/26/2006  . HIV DISEASE 11/25/2004     Problem List Items Addressed This Visit      Other   HIV DISEASE    Rickey Jones continues to have well-controlled HIV/AIDS with good adherence and tolerance to his ART regimen of Biktarvy.  No signs/symptoms of opportunistic infection or progressive HIV disease.  We reviewed his lab work and discussed the plan of care.  Discontinue Bactrim.  Continue current dose of Biktarvy.  Plan for follow-up in 3 months or sooner if needed with lab work 1 to 2 weeks prior to appointment.      AIDS (acquired immune deficiency syndrome) ([place]) - Primary   Healthcare maintenance     Considering Covid vaccination and will hold vaccines today.  Discussed importance of safe sexual practice to reduce risk of STI.  Condoms declined.          I have discontinued Rickey Jones's sulfamethoxazole-trimethoprim. I am also having him maintain his valACYclovir, fluconazole, and bictegravir-emtricitabine-tenofovir AF.   No orders of the defined types were placed in this encounter.    Follow-up: Return in about 3 months (around 06/14/2020), or if symptoms worsen or fail to improve.   Terri Piedra, MSN, FNP-C Nurse Practitioner [facility] for Infectious Disease [place]  number: [phone]

## 2020-03-14 NOTE — Assessment & Plan Note (Signed)
Rickey Jones continues to have well-controlled HIV/AIDS with good adherence and tolerance to his ART regimen of Biktarvy.  No signs/symptoms of opportunistic infection or progressive HIV disease.  We reviewed his lab work and discussed the plan of care.  Discontinue Bactrim.  Continue current dose of Biktarvy.  Plan for follow-up in 3 months or sooner if needed with lab work 1 to 2 weeks prior to appointment.

## 2020-03-14 NOTE — Assessment & Plan Note (Signed)
   Considering Covid vaccination and will hold vaccines today.  Discussed importance of safe sexual practice to reduce risk of STI.  Condoms declined.

## 2020-03-14 NOTE — Patient Instructions (Signed)
Nice to see you.  STOP taking the Bactrim.  Continue to take your [place] daily.  Plan for follow up in 3 months or sooner if needed with lab work 1-2 weeks prior to appointment.  Have a great day and stay safe!

## 2020-04-03 ENCOUNTER — Encounter: Payer: Self-pay | Admitting: Family

## 2020-05-30 ENCOUNTER — Other Ambulatory Visit: Payer: Self-pay

## 2020-05-30 ENCOUNTER — Ambulatory Visit: Payer: Self-pay

## 2020-05-30 DIAGNOSIS — B20 Human immunodeficiency virus [HIV] disease: Secondary | ICD-10-CM

## 2020-05-30 LAB — T-HELPER CELL (CD4) - (RCID CLINIC ONLY)
CD4 % Helper T Cell: 17 % — ABNORMAL LOW (ref 33–65)
CD4 T Cell Abs: 232 /uL — ABNORMAL LOW (ref 400–1790)

## 2020-06-02 ENCOUNTER — Encounter: Payer: Self-pay | Admitting: Family

## 2020-06-03 LAB — COMPREHENSIVE METABOLIC PANEL
AG Ratio: 2 (calc) (ref 1.0–2.5)
ALT: 31 U/L (ref 9–46)
AST: 19 U/L (ref 10–40)
Albumin: 4.7 g/dL (ref 3.6–5.1)
Alkaline phosphatase (APISO): 68 U/L (ref 36–130)
BUN: 12 mg/dL (ref 7–25)
CO2: 25 mmol/L (ref 20–32)
Calcium: 9.8 mg/dL (ref 8.6–10.3)
Chloride: 104 mmol/L (ref 98–110)
Creat: 0.98 mg/dL (ref 0.60–1.35)
Globulin: 2.4 g/dL (calc) (ref 1.9–3.7)
Glucose, Bld: 111 mg/dL — ABNORMAL HIGH (ref 65–99)
Potassium: 4.7 mmol/L (ref 3.5–5.3)
Sodium: 141 mmol/L (ref 135–146)
Total Bilirubin: 0.3 mg/dL (ref 0.2–1.2)
Total Protein: 7.1 g/dL (ref 6.1–8.1)

## 2020-06-03 LAB — HIV-1 RNA QUANT-NO REFLEX-BLD
HIV 1 RNA Quant: 27 Copies/mL — ABNORMAL HIGH
HIV-1 RNA Quant, Log: 1.43 Log cps/mL — ABNORMAL HIGH

## 2020-06-13 ENCOUNTER — Other Ambulatory Visit: Payer: Self-pay

## 2020-06-13 ENCOUNTER — Encounter: Payer: Self-pay | Admitting: Family

## 2020-06-13 ENCOUNTER — Ambulatory Visit (INDEPENDENT_AMBULATORY_CARE_PROVIDER_SITE_OTHER): Payer: Self-pay | Admitting: Family

## 2020-06-13 VITALS — BP 116/75 | HR 125 | Temp 98.1°F | Wt 146.0 lb

## 2020-06-13 DIAGNOSIS — Z79899 Other long term (current) drug therapy: Secondary | ICD-10-CM

## 2020-06-13 DIAGNOSIS — Z0000 Encounter for general adult medical examination without abnormal findings: Secondary | ICD-10-CM

## 2020-06-13 DIAGNOSIS — B20 Human immunodeficiency virus [HIV] disease: Secondary | ICD-10-CM

## 2020-06-13 DIAGNOSIS — Z113 Encounter for screening for infections with a predominantly sexual mode of transmission: Secondary | ICD-10-CM

## 2020-06-13 MED ORDER — BICTEGRAVIR-EMTRICITAB-TENOFOV 50-200-25 MG PO TABS: 1.0000 | ORAL_TABLET | Freq: Every day | ORAL | 5 refills | Status: DC

## 2020-06-13 NOTE — Assessment & Plan Note (Signed)
   Discussed/recommended Covid vaccine which was declined today.  Declines other vaccinations today.  Recommend routine dental care and can refer if necessary.  Discussed importance of safe sexual practice to reduce risk of STI.  Condoms declined.

## 2020-06-13 NOTE — Progress Notes (Signed)
Subjective:    Patient ID: Rickey Jones, male    DOB: [birth year], 44 y.o.   MRN: [id number]  Chief Complaint  Patient presents with  . Follow-up    b20     HPI:  Rickey Jones is a 44 y.o. male with HIV/AIDS with risk factor for acquiring HIV including MSM who was last seen in the office on 03/14/2020 with good adherence and tolerance to his ART regimen of Biktarvy supplemented with Bactrim for OI prophylaxis.  Viral load at the time was found to be 74 with CD4 count of 205.  Most recent blood work completed on 05/30/2020 with a viral load that is undetectable and CD4 count of 232.  Bactrim was discontinued at previous office visit.  Rickey Jones continues to take his Biktarvy daily as prescribed with no adverse side effects or missed doses since his last office visit.  Overall feeling well today with concern for possible exposure to syphilis for which she is following up with the health department.  Currently without symptoms. Denies fevers, chills, night sweats, headaches, changes in vision, neck pain/stiffness, nausea, diarrhea, vomiting, lesions or rashes.  Rickey Jones has no problems obtaining his medication from the pharmacy and has renewed his financial assistance.  Denies any feelings of being down, depressed, or hopeless recently.  Continues to smoke marijuana on occasion and tobacco daily at a rate of approximately 1 pack/day.  Alcohol use is occasional.  Declines condoms today.  Routine dental care most recently completed in April.  Has not received Covid vaccine.    Allergies  Allergen Reactions  . Cephalexin Hives      Outpatient Medications Prior to Visit  Medication Sig Dispense Refill  . bictegravir-emtricitabine-tenofovir AF (BIKTARVY) 50-200-25 MG TABS tablet Take 1 tablet by mouth daily. 30 tablet 5  . fluconazole (DIFLUCAN) 150 MG tablet  (Patient not taking: Reported on 06/13/2020)    . valACYclovir (VALTREX) 1000 MG tablet TAKE 1 TABLET BY MOUTH THREE TIMES A DAY  FOR 7 DAYS (Patient not taking: Reported on 06/13/2020)     No facility-administered medications prior to visit.     Past Medical History:  Diagnosis Date  . HIV (human immunodeficiency virus infection) ([place])   . Shingles   . Thrush of mouth and esophagus ([place])      History reviewed. No pertinent surgical history.   Review of Systems  Constitutional: Negative for appetite change, chills, fatigue, fever and unexpected weight change.  Eyes: Negative for visual disturbance.  Respiratory: Negative for cough, chest tightness, shortness of breath and wheezing.   Cardiovascular: Negative for chest pain and leg swelling.  Gastrointestinal: Negative for abdominal pain, constipation, diarrhea, nausea and vomiting.  Genitourinary: Negative for dysuria, flank pain, frequency, genital sores, hematuria and urgency.  Skin: Negative for rash.  Allergic/Immunologic: Negative for immunocompromised state.  Neurological: Negative for dizziness and headaches.      Objective:    BP 116/75   Pulse (!) 125   Temp 98.1 F (36.7 C) (Oral)   Wt 146 lb (66.2 kg)   BMI 20.95 kg/m  Nursing note and vital signs reviewed.  Physical Exam Constitutional:      General: He is not in acute distress.    Appearance: He is well-developed.  Eyes:     Conjunctiva/sclera: Conjunctivae normal.  Cardiovascular:     Rate and Rhythm: Normal rate and regular rhythm.     Heart sounds: Normal heart sounds. No murmur heard.  No friction rub. No gallop.  Pulmonary:     Effort: Pulmonary effort is normal. No respiratory distress.     Breath sounds: Normal breath sounds. No wheezing or rales.  Chest:     Chest wall: No tenderness.  Abdominal:     General: Bowel sounds are normal.     Palpations: Abdomen is soft.     Tenderness: There is no abdominal tenderness.  Musculoskeletal:     Cervical back: Neck supple.  Lymphadenopathy:     Cervical: No cervical adenopathy.  Skin:    General: Skin is warm and dry.       Findings: No rash.  Neurological:     Mental Status: He is alert and oriented to person, place, and time.  Psychiatric:        Behavior: Behavior normal.        Thought Content: Thought content normal.        Judgment: Judgment normal.      Depression screen [facility] 2/9 06/13/2020 09/28/2019 08/02/2019 06/04/2019 05/03/2019  Decreased Interest 0 0 0 0 0  Down, Depressed, Hopeless 0 0 0 0 1  PHQ - 2 Score 0 0 0 0 1       Assessment & Plan:    Patient Active Problem List   Diagnosis Date Noted  . Screening for STDs (sexually transmitted diseases) 08/02/2019  . Facial rash 05/03/2019  . Healthcare maintenance 05/03/2019  . Tongue coating 05/03/2019  . AIDS (acquired immune deficiency syndrome) ([place]) 04/03/2019  . RASH AND OTHER NONSPECIFIC SKIN ERUPTION 09/26/2007  . PENILE LESION 08/28/2007  . COUGH 08/28/2007  . TOBACCO USER 01/24/2007  . 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 HEPATITIS B, HX OF 12/26/2006  . HIV DISEASE 11/25/2004     Problem List Items Addressed This Visit      Other   AIDS (acquired immune deficiency syndrome) [facility])    Rickey Jones continues to have well-controlled HIV/AIDS with good adherence and tolerance to his ART regimen of Biktarvy with CD4 count now up to 232 and viral load undetectable.  No signs/symptoms of opportunistic infection or progressive HIV disease.  Financial assistance is up-to-date.  Reviewed lab work and discussed plan of care.  Continue current dose of Biktarvy.  Plan for follow-up in 4 months or sooner if needed with lab work 1 to 2 weeks prior to appointment.      Relevant Medications   bictegravir-emtricitabine-tenofovir AF (BIKTARVY) 50-200-25 MG TABS tablet   Other Relevant Orders   HIV-1 RNA quant-no reflex-bld   T-helper cell (CD4)- ([facility] clinic only)   Comprehensive metabolic panel   Healthcare maintenance     Discussed/recommended Covid vaccine which was declined today.  Declines other vaccinations today.  Recommend  routine dental care and can refer if necessary.  Discussed importance of safe sexual practice to reduce risk of STI.  Condoms declined.      Screening for STDs (sexually transmitted diseases) - Primary   Relevant Orders   RPR   Urine cytology ancillary only([place])    Other Visit Diagnoses    Pharmacologic therapy       Relevant Orders   Lipid panel       I have discontinued Rickey Jones's valACYclovir and fluconazole. I am also having him maintain his bictegravir-emtricitabine-tenofovir AF.   Meds ordered this encounter  Medications  . bictegravir-emtricitabine-tenofovir AF (BIKTARVY) 50-200-25 MG TABS tablet    Sig: Take 1 tablet by mouth daily.    Dispense:  30 tablet    Refill:  5  Order Specific Question:   Supervising Provider    Answer:   Carlyle Basques [[id number]]     Follow-up: Return in about 4 months (around 10/13/2020), or if symptoms worsen or fail to improve.   Terri Piedra, MSN, FNP-C Nurse Practitioner [facility] for Infectious Disease [place] number: [phone]

## 2020-06-13 NOTE — Patient Instructions (Addendum)
Nice to see you.  We will continue current dose of Biktarvy.  Refills will be at the pharmacy.   Plan for follow up in 4 months or sooner if needed with lab work 1-2 weeks prior to appointment.   Have a great day and stay safe!

## 2020-06-13 NOTE — Assessment & Plan Note (Signed)
Rickey Jones continues to have well-controlled HIV/AIDS with good adherence and tolerance to his ART regimen of Biktarvy with CD4 count now up to 232 and viral load undetectable.  No signs/symptoms of opportunistic infection or progressive HIV disease.  Financial assistance is up-to-date.  Reviewed lab work and discussed plan of care.  Continue current dose of Biktarvy.  Plan for follow-up in 4 months or sooner if needed with lab work 1 to 2 weeks prior to appointment.

## 2020-10-13 ENCOUNTER — Other Ambulatory Visit: Payer: Self-pay

## 2020-10-13 DIAGNOSIS — Z113 Encounter for screening for infections with a predominantly sexual mode of transmission: Secondary | ICD-10-CM

## 2020-10-13 DIAGNOSIS — B20 Human immunodeficiency virus [HIV] disease: Secondary | ICD-10-CM

## 2020-10-13 DIAGNOSIS — Z79899 Other long term (current) drug therapy: Secondary | ICD-10-CM

## 2020-10-14 LAB — URINE CYTOLOGY ANCILLARY ONLY
Chlamydia: NEGATIVE
Comment: NEGATIVE
Comment: NORMAL
Neisseria Gonorrhea: NEGATIVE

## 2020-10-14 LAB — T-HELPER CELL (CD4) - (RCID CLINIC ONLY)
CD4 % Helper T Cell: 22 % — ABNORMAL LOW (ref 33–65)
CD4 T Cell Abs: 367 /uL — ABNORMAL LOW (ref 400–1790)

## 2020-10-18 LAB — COMPREHENSIVE METABOLIC PANEL
AG Ratio: 2.2 (calc) (ref 1.0–2.5)
ALT: 12 U/L (ref 9–46)
AST: 12 U/L (ref 10–40)
Albumin: 4.6 g/dL (ref 3.6–5.1)
Alkaline phosphatase (APISO): 58 U/L (ref 36–130)
BUN: 12 mg/dL (ref 7–25)
CO2: 33 mmol/L — ABNORMAL HIGH (ref 20–32)
Calcium: 9.4 mg/dL (ref 8.6–10.3)
Chloride: 103 mmol/L (ref 98–110)
Creat: 1.23 mg/dL (ref 0.60–1.35)
Globulin: 2.1 g/dL (calc) (ref 1.9–3.7)
Glucose, Bld: 66 mg/dL (ref 65–99)
Potassium: 4.3 mmol/L (ref 3.5–5.3)
Sodium: 144 mmol/L (ref 135–146)
Total Bilirubin: 0.5 mg/dL (ref 0.2–1.2)
Total Protein: 6.7 g/dL (ref 6.1–8.1)

## 2020-10-18 LAB — LIPID PANEL
Cholesterol: 126 mg/dL (ref <200)
HDL: 37 mg/dL — ABNORMAL LOW (ref >=40)
LDL Cholesterol (Calc): 66 mg/dL (calc)
Non-HDL Cholesterol (Calc): 89 mg/dL (calc) (ref <130)
Total CHOL/HDL Ratio: 3.4 (calc) (ref <5.0)
Triglycerides: 147 mg/dL (ref <150)

## 2020-10-18 LAB — HIV-1 RNA QUANT-NO REFLEX-BLD
HIV 1 RNA Quant: 46 Copies/mL — ABNORMAL HIGH
HIV-1 RNA Quant, Log: 1.66 Log cps/mL — ABNORMAL HIGH

## 2020-10-18 LAB — RPR TITER: RPR Titer: 1:2 {titer} — ABNORMAL HIGH

## 2020-10-18 LAB — FLUORESCENT TREPONEMAL AB(FTA)-IGG-BLD: Fluorescent Treponemal ABS: REACTIVE — AB

## 2020-10-18 LAB — RPR: RPR Ser Ql: REACTIVE — AB

## 2020-10-27 ENCOUNTER — Ambulatory Visit: Payer: Self-pay

## 2020-10-27 ENCOUNTER — Encounter: Payer: Self-pay | Admitting: Family

## 2020-12-16 ENCOUNTER — Ambulatory Visit (INDEPENDENT_AMBULATORY_CARE_PROVIDER_SITE_OTHER): Payer: Self-pay | Admitting: Family

## 2020-12-16 ENCOUNTER — Encounter: Payer: Self-pay | Admitting: Family

## 2020-12-16 ENCOUNTER — Ambulatory Visit: Payer: Self-pay

## 2020-12-16 ENCOUNTER — Other Ambulatory Visit: Payer: Self-pay

## 2020-12-16 VITALS — BP 111/80 | HR 121 | Temp 98.2°F | Wt 150.4 lb

## 2020-12-16 DIAGNOSIS — Z0000 Encounter for general adult medical examination without abnormal findings: Secondary | ICD-10-CM

## 2020-12-16 DIAGNOSIS — Z113 Encounter for screening for infections with a predominantly sexual mode of transmission: Secondary | ICD-10-CM

## 2020-12-16 DIAGNOSIS — B20 Human immunodeficiency virus [HIV] disease: Secondary | ICD-10-CM

## 2020-12-16 MED ORDER — BICTEGRAVIR-EMTRICITAB-TENOFOV 50-200-25 MG PO TABS: 1.0000 | ORAL_TABLET | Freq: Every day | ORAL | 5 refills | Status: DC

## 2020-12-16 NOTE — Assessment & Plan Note (Signed)
Mr. Hoque continues to have well-controlled HIV with good adherence and tolerance to his ART regimen of Biktarvy. No signs/symptoms of opportunistic infection or progressive HIV disease. We reviewed lab work and discussed plan of care. Continue current dose of Biktarvy. Plan for follow-up in 3 months or sooner if needed with lab work 1 to 2 weeks prior to appointment.

## 2020-12-16 NOTE — Patient Instructions (Addendum)
Nice to see you.  Continue to take your [place] daily as prescribed.  Refills have been sent to the pharmacy.   Please call [facility]) to schedule/follow up on your dental care at [phone]  Plan for follow up in 3 months or sooner if needed with lab work 1-2 weeks prior to appointment.   Have a great day and stay safe.

## 2020-12-16 NOTE — Progress Notes (Signed)
Subjective:    Patient ID: Rickey Jones, male    DOB: 1976-06-20, 45 y.o.   MRN: [id number]  Chief Complaint  Patient presents with  . HIV Positive/AIDS     HPI:  Rickey Jones is a 45 y.o. male with HIV/AIDS last seen on 06/13/2020 with well-controlled virus and good adherence and tolerance to his ART regimen of Biktarvy. Viral load at the time was undetectable with CD4 count of 232. Most recent lab work completed on 12/20 with viral load that remains undetectable and CD4 count of 367. Here today for routine follow-up.  Rickey Jones continues to take his Biktarvy daily as prescribed with no adverse side effects or missed doses since his last office visit. Overall feeling well today. Recently diagnosed with Covid in the beginning of the year and has since recovered. Denies fevers, chills, night sweats, headaches, changes in vision, neck pain/stiffness, nausea, diarrhea, vomiting, lesions or rashes.  Rickey Jones has no problems obtaining his medication from the pharmacy. Denies feelings of being down, depressed, or hopeless recently. Continues to use marijuana and consumes alcohol on occasion. Every day tobacco smoker approximately 1 pack/day. Declines condoms. Due for routine dental care.   Allergies  Allergen Reactions  . Cephalexin Hives      Outpatient Medications Prior to Visit  Medication Sig Dispense Refill  . bictegravir-emtricitabine-tenofovir AF (BIKTARVY) 50-200-25 MG TABS tablet Take 1 tablet by mouth daily. 30 tablet 5   No facility-administered medications prior to visit.     Past Medical History:  Diagnosis Date  . HIV (human immunodeficiency virus infection) ([place])   . Shingles   . Thrush of mouth and esophagus ([place])      History reviewed. No pertinent surgical history.   Review of Systems  Constitutional: Negative for appetite change, chills, fatigue, fever and unexpected weight change.  Eyes: Negative for visual disturbance.  Respiratory: Negative for  cough, chest tightness, shortness of breath and wheezing.   Cardiovascular: Negative for chest pain and leg swelling.  Gastrointestinal: Negative for abdominal pain, constipation, diarrhea, nausea and vomiting.  Genitourinary: Negative for dysuria, flank pain, frequency, genital sores, hematuria and urgency.  Skin: Negative for rash.  Allergic/Immunologic: Negative for immunocompromised state.  Neurological: Negative for dizziness and headaches.      Objective:    BP 111/80   Pulse (!) 121   Temp 98.2 F (36.8 C) (Oral)   Wt 150 lb 6.4 oz (68.2 kg)   BMI 21.58 kg/m  Nursing note and vital signs reviewed.  Physical Exam Constitutional:      General: He is not in acute distress.    Appearance: He is well-developed.  HENT:     Mouth/Throat:     Mouth: Oropharynx is clear and moist.  Eyes:     Conjunctiva/sclera: Conjunctivae normal.  Cardiovascular:     Rate and Rhythm: Normal rate and regular rhythm.     Pulses: Intact distal pulses.     Heart sounds: Normal heart sounds. No murmur heard. No friction rub. No gallop.   Pulmonary:     Effort: Pulmonary effort is normal. No respiratory distress.     Breath sounds: Normal breath sounds. No wheezing or rales.  Chest:     Chest wall: No tenderness.  Abdominal:     General: Bowel sounds are normal.     Palpations: Abdomen is soft.     Tenderness: There is no abdominal tenderness.  Musculoskeletal:     Cervical back: Neck supple.  Lymphadenopathy:  Cervical: No cervical adenopathy.  Skin:    General: Skin is warm and dry.     Findings: No rash.  Neurological:     Mental Status: He is alert and oriented to person, place, and time.  Psychiatric:        Mood and Affect: Mood and affect normal.        Behavior: Behavior normal.        Thought Content: Thought content normal.        Judgment: Judgment normal.      Depression screen [facility] 2/9 06/13/2020 09/28/2019 08/02/2019 06/04/2019 05/03/2019  Decreased Interest 0 0 0 0 0   Down, Depressed, Hopeless 0 0 0 0 1  PHQ - 2 Score 0 0 0 0 1       Assessment & Plan:    Patient Active Problem List   Diagnosis Date Noted  . Screening for STDs (sexually transmitted diseases) 08/02/2019  . Facial rash 05/03/2019  . Healthcare maintenance 05/03/2019  . Tongue coating 05/03/2019  . AIDS (acquired immune deficiency syndrome) ([place]) 04/03/2019  . RASH AND OTHER NONSPECIFIC SKIN ERUPTION 09/26/2007  . PENILE LESION 08/28/2007  . COUGH 08/28/2007  . TOBACCO USER 01/24/2007  . 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 HEPATITIS B, HX OF 12/26/2006  . HIV DISEASE 11/25/2004     Problem List Items Addressed This Visit      Other   AIDS (acquired immune deficiency syndrome) [facility])    Rickey Jones continues to have well-controlled HIV with good adherence and tolerance to his ART regimen of Biktarvy. No signs/symptoms of opportunistic infection or progressive HIV disease. We reviewed lab work and discussed plan of care. Continue current dose of Biktarvy. Plan for follow-up in 3 months or sooner if needed with lab work 1 to 2 weeks prior to appointment.      Relevant Medications   bictegravir-emtricitabine-tenofovir AF (BIKTARVY) 50-200-25 MG TABS tablet   Other Relevant Orders   HIV-1 RNA quant-no reflex-bld   Comprehensive metabolic panel   T-helper cell (CD4)- ([facility] clinic only)   Healthcare maintenance - Primary     Discussed importance of safe sexual practice to reduce risk of STI for problems with home.  Declines vaccines.  Due for routine dental care - encouraged to let us know if referral is needed.       Screening for STDs (sexually transmitted diseases)   Relevant Orders   RPR       I am having Rickey Jones maintain his bictegravir-emtricitabine-tenofovir AF.   Meds ordered this encounter  Medications  . bictegravir-emtricitabine-tenofovir AF (BIKTARVY) 50-200-25 MG TABS tablet    Sig: Take 1 tablet by mouth daily.    Dispense:  30 tablet     Refill:  5    Order Specific Question:   Supervising Provider    Answer:   Carlyle Basques [[id number]]     Follow-up: Return in about 3 months (around 03/15/2021), or if symptoms worsen or fail to improve.   Terri Piedra, MSN, FNP-C Nurse Practitioner [facility] for Infectious Disease [place] number: [phone]

## 2020-12-16 NOTE — Assessment & Plan Note (Signed)
   Discussed importance of safe sexual practice to reduce risk of STI for problems with home.  Declines vaccines.  Due for routine dental care - encouraged to let us know if referral is needed.

## 2020-12-24 ENCOUNTER — Encounter: Payer: Self-pay | Admitting: Family

## 2021-03-03 ENCOUNTER — Other Ambulatory Visit: Payer: Self-pay

## 2021-03-03 DIAGNOSIS — Z113 Encounter for screening for infections with a predominantly sexual mode of transmission: Secondary | ICD-10-CM

## 2021-03-03 DIAGNOSIS — B20 Human immunodeficiency virus [HIV] disease: Secondary | ICD-10-CM

## 2021-03-04 LAB — T-HELPER CELL (CD4) - (RCID CLINIC ONLY)
CD4 % Helper T Cell: 22 % — ABNORMAL LOW (ref 33–65)
CD4 T Cell Abs: 330 /uL — ABNORMAL LOW (ref 400–1790)

## 2021-03-06 LAB — COMPREHENSIVE METABOLIC PANEL
AG Ratio: 1.9 (calc) (ref 1.0–2.5)
ALT: 13 U/L (ref 9–46)
AST: 13 U/L (ref 10–40)
Albumin: 4.1 g/dL (ref 3.6–5.1)
Alkaline phosphatase (APISO): 62 U/L (ref 36–130)
BUN: 15 mg/dL (ref 7–25)
CO2: 32 mmol/L (ref 20–32)
Calcium: 9.6 mg/dL (ref 8.6–10.3)
Chloride: 104 mmol/L (ref 98–110)
Creat: 1.1 mg/dL (ref 0.60–1.35)
Globulin: 2.2 g/dL (calc) (ref 1.9–3.7)
Glucose, Bld: 74 mg/dL (ref 65–99)
Potassium: 5.1 mmol/L (ref 3.5–5.3)
Sodium: 141 mmol/L (ref 135–146)
Total Bilirubin: 0.3 mg/dL (ref 0.2–1.2)
Total Protein: 6.3 g/dL (ref 6.1–8.1)

## 2021-03-06 LAB — RPR: RPR Ser Ql: REACTIVE — AB

## 2021-03-06 LAB — RPR TITER: RPR Titer: 1:2 {titer} — ABNORMAL HIGH

## 2021-03-06 LAB — HIV-1 RNA QUANT-NO REFLEX-BLD
HIV 1 RNA Quant: 20 Copies/mL — ABNORMAL HIGH
HIV-1 RNA Quant, Log: 1.3 Log cps/mL — ABNORMAL HIGH

## 2021-03-06 LAB — FLUORESCENT TREPONEMAL AB(FTA)-IGG-BLD: Fluorescent Treponemal ABS: REACTIVE — AB

## 2021-03-17 ENCOUNTER — Other Ambulatory Visit: Payer: Self-pay

## 2021-03-17 ENCOUNTER — Ambulatory Visit (INDEPENDENT_AMBULATORY_CARE_PROVIDER_SITE_OTHER): Payer: Self-pay | Admitting: Family

## 2021-03-17 ENCOUNTER — Encounter: Payer: Self-pay | Admitting: Family

## 2021-03-17 VITALS — BP 119/76 | HR 105 | Temp 98.1°F | Wt 148.0 lb

## 2021-03-17 DIAGNOSIS — Z113 Encounter for screening for infections with a predominantly sexual mode of transmission: Secondary | ICD-10-CM

## 2021-03-17 DIAGNOSIS — Z79899 Other long term (current) drug therapy: Secondary | ICD-10-CM

## 2021-03-17 DIAGNOSIS — B20 Human immunodeficiency virus [HIV] disease: Secondary | ICD-10-CM

## 2021-03-17 DIAGNOSIS — Z0000 Encounter for general adult medical examination without abnormal findings: Secondary | ICD-10-CM

## 2021-03-17 MED ORDER — BICTEGRAVIR-EMTRICITAB-TENOFOV 50-200-25 MG PO TABS: 1.0000 | ORAL_TABLET | Freq: Every day | ORAL | 5 refills | Status: DC

## 2021-03-17 NOTE — Assessment & Plan Note (Signed)
Rickey Jones continues to have well-controlled HIV disease with good adherence and tolerance to his ART regimen of Biktarvy.  He has been undetectable for over 2 years now since reentering care.  No signs/symptoms of opportunistic infection.  We reviewed lab work and discussed plan of care.  Continue current dose of Biktarvy.  Plan for follow-up in 6 months or sooner if needed with lab work 1 to 2 weeks prior to appointment.

## 2021-03-17 NOTE — Progress Notes (Signed)
Brief Narrative   Patient ID: Rickey Jones, male    DOB: Dec 08, 1975, 45 y.o.   MRN: [id number]    Subjective:    Chief Complaint  Patient presents with  . Follow-up    B20     HPI:  Rickey Jones is a 45 y.o. male with HIV disease last seen on on 01/13/2021 with well-controlled virus and good adherence and tolerance to his ART regimen of Biktarvy.  Viral load was undetectable with CD4 count of 367.  Most recent blood work completed on 03/03/2021 with viral load that remains undetectable and CD4 count of 330.  RPR titer stable at 1: 2.  Kidney function, liver function, electrolytes within normal ranges.  Here today for routine follow-up.  Rickey Jones continues to take his Biktarvy daily as prescribed with no adverse side effects or missed doses since his last office visit.  Overall feeling well today with no new concerns/complaints. Denies fevers, chills, night sweats, headaches, changes in vision, neck pain/stiffness, nausea, diarrhea, vomiting, lesions or rashes.  Rickey Jones has no problems obtaining medication from the pharmacy and remains covered through [place].  Denies feelings of being down, depressed, or hopeless recently.  No current recreational or illicit drug use, alcohol consumption, and smokes approximately 1 pack of cigarettes per day on average.  Condoms offered and declined.  Recently notified by partner that he tested positive for syphilis and was treated with doxycycline.  Allergies  Allergen Reactions  . Cephalexin Hives      Outpatient Medications Prior to Visit  Medication Sig Dispense Refill  . bictegravir-emtricitabine-tenofovir AF (BIKTARVY) 50-200-25 MG TABS tablet Take 1 tablet by mouth daily. 30 tablet 5   No facility-administered medications prior to visit.     Past Medical History:  Diagnosis Date  . HIV (human immunodeficiency virus infection) ([place])   . Shingles   . Thrush of mouth and esophagus ([place])      History reviewed. No pertinent  surgical history.     Review of Systems  Constitutional: Negative for appetite change, chills, fatigue, fever and unexpected weight change.  Eyes: Negative for visual disturbance.  Respiratory: Negative for cough, chest tightness, shortness of breath and wheezing.   Cardiovascular: Negative for chest pain and leg swelling.  Gastrointestinal: Negative for abdominal pain, constipation, diarrhea, nausea and vomiting.  Genitourinary: Negative for dysuria, flank pain, frequency, genital sores, hematuria and urgency.  Skin: Negative for rash.  Allergic/Immunologic: Negative for immunocompromised state.  Neurological: Negative for dizziness and headaches.      Objective:    BP 119/76   Pulse (!) 105   Temp 98.1 F (36.7 C) (Oral)   Wt 148 lb (67.1 kg)   BMI 21.24 kg/m  Nursing note and vital signs reviewed.  Physical Exam Constitutional:      General: He is not in acute distress.    Appearance: He is well-developed.  Eyes:     Conjunctiva/sclera: Conjunctivae normal.  Cardiovascular:     Rate and Rhythm: Normal rate and regular rhythm.     Heart sounds: Normal heart sounds. No murmur heard. No friction rub. No gallop.   Pulmonary:     Effort: Pulmonary effort is normal. No respiratory distress.     Breath sounds: Normal breath sounds. No wheezing or rales.  Chest:     Chest wall: No tenderness.  Abdominal:     General: Bowel sounds are normal.     Palpations: Abdomen is soft.     Tenderness: There is no  abdominal tenderness.  Musculoskeletal:     Cervical back: Neck supple.  Lymphadenopathy:     Cervical: No cervical adenopathy.  Skin:    General: Skin is warm and dry.     Findings: No rash.  Neurological:     Mental Status: He is alert and oriented to person, place, and time.  Psychiatric:        Behavior: Behavior normal.        Thought Content: Thought content normal.        Judgment: Judgment normal.      Depression screen [facility] 2/9 03/17/2021 06/13/2020  09/28/2019 08/02/2019 06/04/2019  Decreased Interest 0 0 0 0 0  Down, Depressed, Hopeless 0 0 0 0 0  PHQ - 2 Score 0 0 0 0 0       Assessment & Plan:    Patient Active Problem List   Diagnosis Date Noted  . Screening for STDs (sexually transmitted diseases) 08/02/2019  . Facial rash 05/03/2019  . Healthcare maintenance 05/03/2019  . Tongue coating 05/03/2019  . AIDS (acquired immune deficiency syndrome) ([place]) 04/03/2019  . RASH AND OTHER NONSPECIFIC SKIN ERUPTION 09/26/2007  . PENILE LESION 08/28/2007  . COUGH 08/28/2007  . TOBACCO USER 01/24/2007  . 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 HEPATITIS B, HX OF 12/26/2006  . HIV DISEASE 11/25/2004     Problem List Items Addressed This Visit      Other   HIV DISEASE    Rickey Jones continues to have well-controlled HIV disease with good adherence and tolerance to his ART regimen of Biktarvy.  He has been undetectable for over 2 years now since reentering care.  No signs/symptoms of opportunistic infection.  We reviewed lab work and discussed plan of care.  Continue current dose of Biktarvy.  Plan for follow-up in 6 months or sooner if needed with lab work 1 to 2 weeks prior to appointment.      Relevant Medications   bictegravir-emtricitabine-tenofovir AF (BIKTARVY) 50-200-25 MG TABS tablet   AIDS (acquired immune deficiency syndrome) ([facility])   Relevant Medications   bictegravir-emtricitabine-tenofovir AF (BIKTARVY) 50-200-25 MG TABS tablet   Other Relevant Orders   HIV-1 RNA quant-no reflex-bld   T-helper cell (CD4)- ([facility] clinic only)   Comprehensive metabolic panel   Healthcare maintenance     Discussed importance of safe sexual practice to reduce risk of STI.  Condoms declined.  Declines vaccines.  Discussed/recommended COVID-vaccine.  Routine dental care up-to-date per recommendations.      Screening for STDs (sexually transmitted diseases) - Primary   Relevant Orders   RPR    Other Visit Diagnoses    Pharmacologic  therapy       Relevant Orders   Lipid panel       I am having Rickey Jones maintain his bictegravir-emtricitabine-tenofovir AF.   Meds ordered this encounter  Medications  . bictegravir-emtricitabine-tenofovir AF (BIKTARVY) 50-200-25 MG TABS tablet    Sig: Take 1 tablet by mouth daily.    Dispense:  30 tablet    Refill:  5    Order Specific Question:   Supervising Provider    Answer:   Carlyle Basques [[id number]]     Follow-up: Return in about 6 months (around 09/17/2021), or if symptoms worsen or fail to improve.   Terri Piedra, MSN, FNP-C Nurse Practitioner [facility] for Infectious Disease [place] number: [phone]

## 2021-03-17 NOTE — Patient Instructions (Signed)
Nice to see you.  Continue to take medication today as prescribed.  Refills have been sent to the pharmacy.  Renew financial assistance after July 1st   Contact dental staff for routine dental care.  Plan for follow up in 6 months or sooner if needed with lab work 1-2 weeks prior to appointment.

## 2021-03-17 NOTE — Assessment & Plan Note (Signed)
   Discussed importance of safe sexual practice to reduce risk of STI.  Condoms declined.  Declines vaccines.  Discussed/recommended COVID-vaccine.  Routine dental care up-to-date per recommendations.

## 2021-05-04 IMAGING — CT CT ANGIOGRAPHY CHEST
1 of 6 series · 4 of 16 positions shown · IV contrast (omnipaque)
Comparison: None.

CLINICAL DATA: Chest congestion, weight loss

EXAM:
CT ANGIOGRAPHY CHEST WITH CONTRAST
TECHNIQUE: Multidetector CT imaging of the chest was performed using the
standard protocol during bolus administration of intravenous
contrast. Multiplanar CT image reconstructions and MIPs were
obtained to evaluate the vascular anatomy.
CONTRAST:  80mL OMNIPAQUE IOHEXOL 350 MG/ML SOLN

[Series 7: pe thins · axial · 0.70mm/px · z∈[+1227,+1398]mm · 4 of 407 slices shown]
[im 82/407  lung]
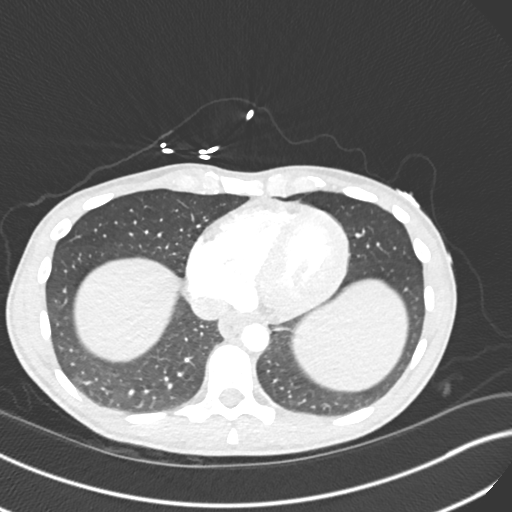
[im 163/407  soft-tissue]
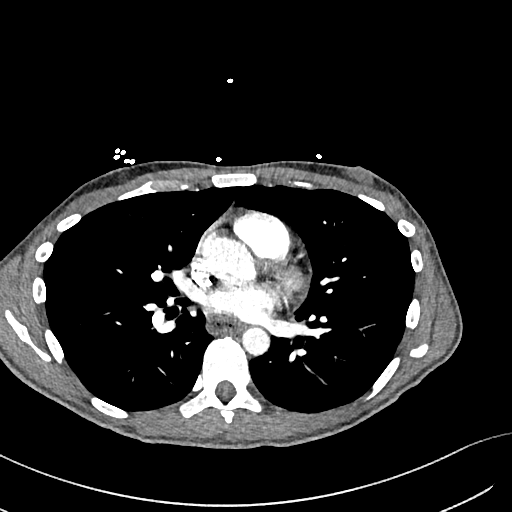
[im 244/407  lung]
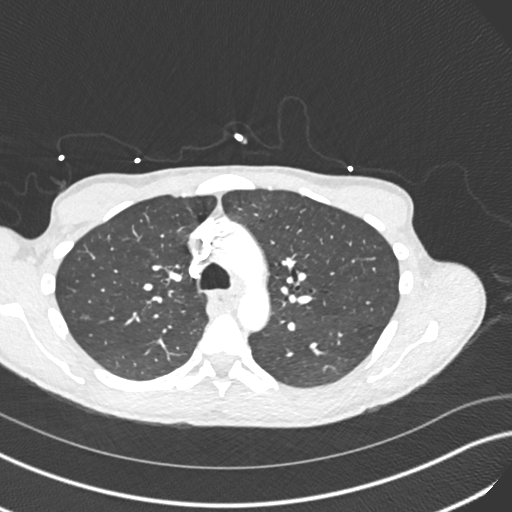
[im 325/407  soft-tissue]
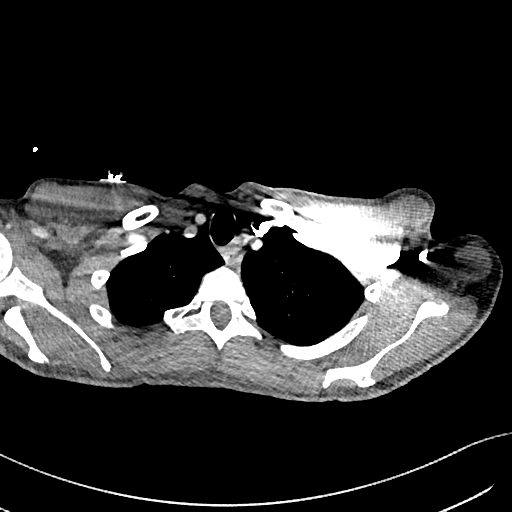

[4 of 16 positions shown; findings below may reference images not displayed]

FINDINGS: Cardiovascular: Satisfactory opacification of the pulmonary arteries
to the segmental level. No evidence of pulmonary embolism. Normal
heart size. No pericardial effusion.

Mediastinum/Nodes: No enlarged mediastinal, hilar, or axillary lymph
nodes. Thyroid gland and trachea are normal. There is mild, diffuse
thickening of the esophagus with internal debris.

Lungs/Pleura: There is minimal, clustered nodularity of the lingula
(series 6, image 25). No pleural effusion or pneumothorax.

Upper Abdomen: No acute abnormality. The partially imaged spleen is
enlarged, measuring at least 13.4 cm in maximum span.

Musculoskeletal: No chest wall abnormality. No acute or significant
osseous findings.

Review of the MIP images confirms the above findings.
IMPRESSION: 1.  Negative examination for pulmonary embolism.

2. There is minimal, clustered infectious or inflammatory nodularity
of the lingula (series 6, image 25).

3. There is mild, diffuse thickening of the esophagus with internal
debris, of uncertain significance, possibly related to reflux
esophagitis.

4.  Partially imaged splenomegaly.

## 2021-05-13 ENCOUNTER — Ambulatory Visit: Payer: Self-pay

## 2021-05-15 ENCOUNTER — Ambulatory Visit: Payer: Self-pay

## 2021-05-15 ENCOUNTER — Other Ambulatory Visit: Payer: Self-pay

## 2021-05-15 ENCOUNTER — Encounter: Payer: Self-pay | Admitting: Family

## 2021-07-06 ENCOUNTER — Ambulatory Visit (HOSPITAL_COMMUNITY)
Admission: EM | Admit: 2021-07-06 | Discharge: 2021-07-06 | Disposition: A | Discharge status: Home / Self Care | Payer: Self-pay | Attending: Internal Medicine | Admitting: Internal Medicine

## 2021-07-06 ENCOUNTER — Encounter (HOSPITAL_COMMUNITY): Payer: Self-pay | Admitting: Emergency Medicine

## 2021-07-06 ENCOUNTER — Other Ambulatory Visit: Payer: Self-pay

## 2021-07-06 DIAGNOSIS — J019 Acute sinusitis, unspecified: Secondary | ICD-10-CM

## 2021-07-06 DIAGNOSIS — B9789 Other viral agents as the cause of diseases classified elsewhere: Secondary | ICD-10-CM

## 2021-07-06 DIAGNOSIS — K6289 Other specified diseases of anus and rectum: Secondary | ICD-10-CM

## 2021-07-06 MED ORDER — GUAIFENESIN ER 600 MG PO TB12: 600.0000 mg | ORAL_TABLET | Freq: Two times a day (BID) | ORAL | 0 refills | Status: AC

## 2021-07-06 MED ORDER — BENZONATATE 100 MG PO CAPS: 100.0000 mg | ORAL_CAPSULE | Freq: Three times a day (TID) | ORAL | 0 refills | Status: DC | PRN

## 2021-07-06 MED ORDER — FLUTICASONE PROPIONATE 50 MCG/ACT NA SUSP: 1.0000 | Freq: Every day | NASAL | 0 refills | Status: DC

## 2021-07-06 NOTE — ED Triage Notes (Signed)
Pt reports having issues with constipation for past 10 days, last BM on Saturday. Reports having to strain to have BM. Today tried straining and had pinkish mucous.  also having congestion, left ear pain, and sore throat that started over the weekend.

## 2021-07-06 NOTE — ED Provider Notes (Signed)
[place]    CSN: [id number] Arrival date & time: 07/06/21  1427      History   Chief Complaint Chief Complaint  Patient presents with   Constipation   Nasal Congestion    HPI Rickey Jones is a 45 y.o. male homosexual male with a history of HIV and engages in anal receptive sexual intercourse comes to urgent care complaining of anal discomfort and mucoid/bloody bowel movement over the past 10 days.  He has the urge for bowel movement but passes only mucoid bloody stool when that happens.  No lower abdominal pain.  No pelvic pain.  No urethral discharge.  Patient recently engaged in unprotected anal receptive sexual intercourse with another male.  No rash in the perianal area  Patient also complains of nasal congestion, postnasal drip and cough over the past 3 to 4 days.  He has some left ear pain as well as sore throat.  No fever or chills.  No generalized body aches.  Patient is a poor historian and is not forthcoming with a clear history.  No shortness of breath.   HPI  Past Medical History:  Diagnosis Date   HIV (human immunodeficiency virus infection) ([place])    Shingles    Thrush of mouth and esophagus ([place])     Patient Active Problem List   Diagnosis Date Noted   Screening for STDs (sexually transmitted diseases) 08/02/2019   Facial rash 05/03/2019   Healthcare maintenance 05/03/2019   Tongue coating 05/03/2019   AIDS (acquired immune deficiency syndrome) ([place]) 04/03/2019   RASH AND OTHER NONSPECIFIC SKIN ERUPTION 09/26/2007   PENILE LESION 08/28/2007   COUGH 08/28/2007   TOBACCO USER 01/24/2007   HUMAN PAPILLOMAVIRUS 12/26/2006   HEPATITIS B, HX OF 12/26/2006   HIV DISEASE 11/25/2004    History reviewed. No pertinent surgical history.     Home Medications    Prior to Admission medications   Medication Sig Start Date End Date Taking? Authorizing Provider  benzonatate (TESSALON) 100 MG capsule Take 1 capsule (100 mg total) by mouth 3 (three)  times daily as needed for cough. 07/06/21  Yes Larysa Pall, Myrene Galas, MD  fluticasone (FLONASE) 50 MCG/ACT nasal spray Place 1 spray into both nostrils daily. 07/06/21  Yes Yemaya Barnier, Myrene Galas, MD  guaiFENesin (MUCINEX) 600 MG 12 hr tablet Take 1 tablet (600 mg total) by mouth 2 (two) times daily for 10 days. 07/06/21 07/16/21 Yes Kendon Sedeno, Myrene Galas, MD  bictegravir-emtricitabine-tenofovir AF (BIKTARVY) 50-200-25 MG TABS tablet Take 1 tablet by mouth daily. 03/17/21   [place] Circle, FNP    Family History No family history on file.  Social History Social History   Tobacco Use   Smoking status: Every Day    Packs/day: 1.00    Years: 20.00    Pack years: 20.00    Types: Cigarettes   Smokeless tobacco: Never  Vaping Use   Vaping Use: Never used  Substance Use Topics   Alcohol use: Not Currently    Alcohol/week: 1.0 standard drink    Types: 1 Standard drinks or equivalent per week    Comment: occasional   Drug use: Not Currently    Types: Marijuana    Comment: occassional;      Allergies   Cephalexin   Review of Systems Review of Systems  Cardiovascular: Negative.   Gastrointestinal:  Positive for rectal pain. Negative for nausea and vomiting.  Genitourinary:  Negative for dysuria, frequency, penile discharge, penile pain, scrotal swelling and testicular pain.  Neurological: Negative.     Physical Exam Triage Vital Signs ED Triage Vitals  Enc Vitals Group     BP 07/06/21 1501 101/73     Pulse Rate 07/06/21 1501 (!) 114     Resp 07/06/21 1501 17     Temp 07/06/21 1501 99.7 F (37.6 C)     Temp Source 07/06/21 1501 Oral     SpO2 07/06/21 1501 98 %     Weight --      Height --      Head Circumference --      Peak Flow --      Pain Score 07/06/21 1500 6     Pain Loc --      Pain Edu? --      Excl. in [place]? --    No data found.  Updated Vital Signs BP 101/73 (BP Location: Left Arm)   Pulse (!) 114   Temp 99.7 F (37.6 C) (Oral)   Resp 17   SpO2 98%   Visual  Acuity Right Eye Distance:   Left Eye Distance:   Bilateral Distance:    Right Eye Near:   Left Eye Near:    Bilateral Near:     Physical Exam Vitals and nursing note reviewed.  Constitutional:      General: He is not in acute distress.    Appearance: He is not ill-appearing.  Cardiovascular:     Rate and Rhythm: Normal rate and regular rhythm.  Pulmonary:     Effort: Pulmonary effort is normal.     Breath sounds: Normal breath sounds.  Abdominal:     General: Bowel sounds are normal.     Palpations: Abdomen is soft.  Genitourinary:    Penis: Normal.      Testes: Normal.     Rectum: Normal.  Musculoskeletal:        General: Normal range of motion.  Neurological:     Mental Status: He is alert.     UC Treatments / Results  Labs (all labs ordered are listed, but only abnormal results are displayed) Labs Reviewed  CYTOLOGY, (ORAL, ANAL, URETHRAL) ANCILLARY ONLY    EKG   Radiology No results found.  Procedures Procedures (including critical care time)  Medications Ordered in UC Medications - No data to display  Initial Impression / Assessment and Plan / UC Course  I have reviewed the triage vital signs and the nursing notes.  Pertinent labs & imaging results that were available during my care of the patient were reviewed by me and considered in my medical decision making (see chart for details).     1.  Proctitis: Cytology for GC/chlamydia/trichomonas We will call patient with recommendations if labs are abnormal Avoid sexual intercourse until lab results are available  2.  Acute viral sinusitis: Supportive care with Flonase, Mucinex, saline nasal spray and Tessalon Perles as needed for cough. If symptoms worsen please return to the urgent care to be reevaluated. Final Clinical Impressions(s) / UC Diagnoses   Final diagnoses:  Proctitis  Acute viral sinusitis     Discharge Instructions      Please take medications as prescribed We will call  you with recommendations if labs are abnormal Avoid sexual intercourse until the test results are available Return to urgent care if symptoms worsen.   ED Prescriptions     Medication Sig Dispense Auth. Provider   fluticasone (FLONASE) 50 MCG/ACT nasal spray Place 1 spray into both nostrils daily. 16 g Chase Picket,  MD   guaiFENesin (MUCINEX) 600 MG 12 hr tablet Take 1 tablet (600 mg total) by mouth 2 (two) times daily for 10 days. 20 tablet Adenike Shidler, Myrene Galas, MD   benzonatate (TESSALON) 100 MG capsule Take 1 capsule (100 mg total) by mouth 3 (three) times daily as needed for cough. 21 capsule Kjersti Dittmer, Myrene Galas, MD      PDMP not reviewed this encounter.   Chase Picket, MD 07/06/21 [phone]

## 2021-07-06 NOTE — Discharge Instructions (Addendum)
Please take medications as prescribed We will call you with recommendations if labs are abnormal Avoid sexual intercourse until the test results are available Return to urgent care if symptoms worsen.

## 2021-07-07 LAB — CYTOLOGY, (ORAL, ANAL, URETHRAL) ANCILLARY ONLY
Chlamydia: POSITIVE — AB
Comment: NEGATIVE
Comment: NEGATIVE
Comment: NORMAL
Neisseria Gonorrhea: NEGATIVE
Trichomonas: NEGATIVE

## 2021-07-09 ENCOUNTER — Other Ambulatory Visit: Payer: Self-pay

## 2021-07-09 ENCOUNTER — Telehealth (HOSPITAL_COMMUNITY): Payer: Self-pay | Admitting: Emergency Medicine

## 2021-07-09 ENCOUNTER — Emergency Department (HOSPITAL_COMMUNITY)
Admission: EM | Admit: 2021-07-09 | Discharge: 2021-07-09 | Disposition: A | Discharge status: Home / Self Care | Payer: Self-pay | Attending: Student | Admitting: Student

## 2021-07-09 ENCOUNTER — Encounter (HOSPITAL_COMMUNITY): Payer: Self-pay | Admitting: Emergency Medicine

## 2021-07-09 DIAGNOSIS — Z5321 Procedure and treatment not carried out due to patient leaving prior to being seen by health care provider: Secondary | ICD-10-CM | POA: Insufficient documentation

## 2021-07-09 DIAGNOSIS — J3489 Other specified disorders of nose and nasal sinuses: Secondary | ICD-10-CM | POA: Insufficient documentation

## 2021-07-09 MED ORDER — DOXYCYCLINE HYCLATE 100 MG PO CAPS: 100.0000 mg | ORAL_CAPSULE | Freq: Two times a day (BID) | ORAL | 0 refills | Status: AC

## 2021-07-09 NOTE — ED Triage Notes (Addendum)
Pt states he went to UC on Monday due to constant straining while having a BM. Pt also felt some sinus pressure and drainage. Pt states his mucous smells the same as his rectum. Pt states he just saw that his test came back positive for chlamydia. Pt is concerned this infection has spread to his sinuses.

## 2021-07-09 NOTE — ED Provider Notes (Signed)
Emergency Medicine Provider Triage Evaluation Note  Rickey Jones , a 45 y.o. male  was evaluated in triage.  Patient seen at urgent care 3 days ago with a complaint of nasal pressure and straining with bowel movements.  He engages in anal receptive intercourse and recently had unprotected intercourse with a new partner.  Anal swab at urgent care positive for chlamydia and gonorrhea.  Pt complains of continued draining with bowel movements as well as nasal pressure and reports that his congestion smells just like his discharge.  Review of Systems  Positive: Nasal pressure and difficulty with bowel movements Negative: Dysuria, hematuria, penile discharge  Physical Exam  BP 115/71 (BP Location: Left Arm)   Pulse (!) 106   Temp 98.9 F (37.2 C) (Oral)   Resp 20   SpO2 99%  Gen:   Awake, no distress   Resp:  Normal effort  MSK:   Moves extremities without difficulty  Other:    Medical Decision Making  Medically screening exam initiated at 11:21 AM.  Appropriate orders placed.  Rickey Jones was informed that the remainder of the evaluation will be completed by another provider, this initial triage assessment does not replace that evaluation, and the importance of remaining in the ED until their evaluation is complete.  Needs GC chlamydia swab of oropharynx due to a unprotected oral intercourse   Rickey Jones A, PA-C 07/09/21 1134    Horton, Alvin Critchley, DO 07/09/21 1314

## 2021-07-09 NOTE — ED Notes (Signed)
Pt left. [facility] called and gave him a prescription so he was going to leave to pick it up. Pt gave this tech his labels and left.

## 2021-08-19 ENCOUNTER — Telehealth: Payer: Self-pay

## 2021-08-19 NOTE — Telephone Encounter (Signed)
Received call from [facility] requesting update on PA status for Biktarvy, RN called [facility] store on [address] and advised them that patient does not need PA as we are just waiting on ADAP approval.  Beryle Flock, RN

## 2021-09-03 ENCOUNTER — Other Ambulatory Visit: Payer: Self-pay

## 2021-09-03 DIAGNOSIS — Z79899 Other long term (current) drug therapy: Secondary | ICD-10-CM

## 2021-09-03 DIAGNOSIS — B20 Human immunodeficiency virus [HIV] disease: Secondary | ICD-10-CM

## 2021-09-03 DIAGNOSIS — Z113 Encounter for screening for infections with a predominantly sexual mode of transmission: Secondary | ICD-10-CM

## 2021-09-04 LAB — T-HELPER CELL (CD4) - (RCID CLINIC ONLY)
CD4 % Helper T Cell: 21 % — ABNORMAL LOW (ref 33–65)
CD4 T Cell Abs: 396 /uL — ABNORMAL LOW (ref 400–1790)

## 2021-09-07 LAB — COMPREHENSIVE METABOLIC PANEL
AG Ratio: 1.8 (calc) (ref 1.0–2.5)
ALT: 20 U/L (ref 9–46)
AST: 18 U/L (ref 10–40)
Albumin: 4.6 g/dL (ref 3.6–5.1)
Alkaline phosphatase (APISO): 71 U/L (ref 36–130)
BUN: 11 mg/dL (ref 7–25)
CO2: 30 mmol/L (ref 20–32)
Calcium: 9.5 mg/dL (ref 8.6–10.3)
Chloride: 100 mmol/L (ref 98–110)
Creat: 1.17 mg/dL (ref 0.60–1.29)
Globulin: 2.6 g/dL (calc) (ref 1.9–3.7)
Glucose, Bld: 144 mg/dL — ABNORMAL HIGH (ref 65–99)
Potassium: 4.4 mmol/L (ref 3.5–5.3)
Sodium: 137 mmol/L (ref 135–146)
Total Bilirubin: 0.9 mg/dL (ref 0.2–1.2)
Total Protein: 7.2 g/dL (ref 6.1–8.1)

## 2021-09-07 LAB — LIPID PANEL
Cholesterol: 148 mg/dL (ref <200)
HDL: 46 mg/dL (ref >=40)
LDL Cholesterol (Calc): 85 mg/dL (calc)
Non-HDL Cholesterol (Calc): 102 mg/dL (calc) (ref <130)
Total CHOL/HDL Ratio: 3.2 (calc) (ref <5.0)
Triglycerides: 82 mg/dL (ref <150)

## 2021-09-07 LAB — HIV-1 RNA QUANT-NO REFLEX-BLD
HIV 1 RNA Quant: 26 Copies/mL — ABNORMAL HIGH
HIV-1 RNA Quant, Log: 1.41 Log cps/mL — ABNORMAL HIGH

## 2021-09-07 LAB — RPR: RPR Ser Ql: REACTIVE — AB

## 2021-09-07 LAB — RPR TITER: RPR Titer: 1:2 {titer} — ABNORMAL HIGH

## 2021-09-07 LAB — FLUORESCENT TREPONEMAL AB(FTA)-IGG-BLD: Fluorescent Treponemal ABS: REACTIVE — AB

## 2021-09-21 ENCOUNTER — Other Ambulatory Visit: Payer: Self-pay

## 2021-09-21 ENCOUNTER — Ambulatory Visit (INDEPENDENT_AMBULATORY_CARE_PROVIDER_SITE_OTHER): Payer: Self-pay | Admitting: Family

## 2021-09-21 ENCOUNTER — Encounter: Payer: Self-pay | Admitting: Family

## 2021-09-21 VITALS — BP 118/79 | HR 114 | Resp 16 | Ht 70.0 in | Wt 156.0 lb

## 2021-09-21 DIAGNOSIS — R052 Subacute cough: Secondary | ICD-10-CM

## 2021-09-21 DIAGNOSIS — Z0000 Encounter for general adult medical examination without abnormal findings: Secondary | ICD-10-CM

## 2021-09-21 DIAGNOSIS — B20 Human immunodeficiency virus [HIV] disease: Secondary | ICD-10-CM

## 2021-09-21 MED ORDER — BICTEGRAVIR-EMTRICITAB-TENOFOV 50-200-25 MG PO TABS: 1.0000 | ORAL_TABLET | Freq: Every day | ORAL | 5 refills | Status: DC

## 2021-09-21 MED ORDER — LEVOFLOXACIN 500 MG PO TABS: 500.0000 mg | ORAL_TABLET | Freq: Every day | ORAL | 0 refills | Status: DC

## 2021-09-21 NOTE — Assessment & Plan Note (Signed)
   Discussed importance of safe sexual practice and condom usage.  Condoms offered.  Vaccines discussed and declined.

## 2021-09-21 NOTE — Progress Notes (Signed)
Brief Narrative   Patient ID: Rickey Jones, male    DOB: July 09, 1976, 44 y.o.   MRN: [id number]  [name] is a 45 y/o gentleman initially diagnosed with HIV around 2003 with risk factor of MSM. Initial viral load and CD4 count are unknown. History of thrush. Genotype from 04/03/19 with no medication resistance mutations. [id number] negative. Previous ART experience with [facility], [facility], and now [employer].   Subjective:    Chief Complaint  Patient presents with   Follow-up    Sinus issues are still bad. Lost job 2 months ago and has had depression related to that.  Declined Flu shot and condoms     HPI:  Rickey Jones is a 45 y.o. male with HIV disease last seen on 03/17/2021 with well-controlled virus and good adherence and tolerance to his ART regimen of Biktarvy.  Viral load was undetectable with CD4 count of 330.  RPR titer was stable at 1: 2.  Most recent blood work completed in 09/03/2021 with viral load that remains undetectable and CD4 count of 396.  Kidney function, liver function, electrolytes within normal ranges.  Lipid profile also normal with triglycerides 82, LDL 85, and HDL 46.  Here today for routine follow-up.  [name] to take his Biktarvy daily as prescribed with no adverse side effects.  Been having increased levels of anxiety recently secondary to several life stressors including job loss and recent sinus infection.  Continues to have congestion, sinus pain/pressure and a productive cough. Denies fevers, chills, night sweats, headaches, changes in vision, neck pain/stiffness, nausea, diarrhea, vomiting, lesions or rashes.  [name] has no problems obtaining medication from the pharmacy and is now covered through [place]. Has been having feelings of being down and anxiousness. Denies suicidal ideations. Currently working on finding employment. Continues to drink alcohol and smoke marijuana on occasion with daily tobacco use. Condoms offered. Declines vaccines.     Allergies  Allergen Reactions   Cephalexin Hives      Outpatient Medications Prior to Visit  Medication Sig Dispense Refill   bictegravir-emtricitabine-tenofovir AF (BIKTARVY) 50-200-25 MG TABS tablet Take 1 tablet by mouth daily. 30 tablet 5   benzonatate (TESSALON) 100 MG capsule Take 1 capsule (100 mg total) by mouth 3 (three) times daily as needed for cough. (Patient not taking: Reported on 09/21/2021) 21 capsule 0   fluticasone (FLONASE) 50 MCG/ACT nasal spray Place 1 spray into both nostrils daily. (Patient not taking: Reported on 09/21/2021) 16 g 0   No facility-administered medications prior to visit.     Past Medical History:  Diagnosis Date   HIV (human immunodeficiency virus infection) ([place])    Shingles    Thrush of mouth and esophagus ([place])      History reviewed. No pertinent surgical history.    Review of Systems  Constitutional:  Negative for appetite change, chills, fatigue, fever and unexpected weight change.  HENT:  Positive for congestion, sinus pressure and sinus pain.   Eyes:  Negative for visual disturbance.  Respiratory:  Positive for cough. Negative for chest tightness, shortness of breath and wheezing.   Cardiovascular:  Negative for chest pain and leg swelling.  Gastrointestinal:  Negative for abdominal pain, constipation, diarrhea, nausea and vomiting.  Genitourinary:  Negative for dysuria, flank pain, frequency, genital sores, hematuria and urgency.  Skin:  Negative for rash.  Allergic/Immunologic: Negative for immunocompromised state.  Neurological:  Negative for dizziness and headaches.     Objective:    BP 118/79  Pulse (!) 114   Resp 16   Ht 5\' 10"  (1.778 m)   Wt 156 lb (70.8 kg)   BMI 22.38 kg/m  Nursing note and vital signs reviewed.  Physical Exam Constitutional:      Appearance: He is well-developed. He is not ill-appearing.  HENT:     Right Ear: Hearing, tympanic membrane, ear canal and external ear normal.     Left  Ear: Hearing, tympanic membrane, ear canal and external ear normal.     Nose:     Right Sinus: Maxillary sinus tenderness and frontal sinus tenderness present.     Left Sinus: Maxillary sinus tenderness and frontal sinus tenderness present.     Mouth/Throat:     Pharynx: Uvula midline.  Cardiovascular:     Rate and Rhythm: Normal rate and regular rhythm.     Heart sounds: Normal heart sounds.  Pulmonary:     Effort: Pulmonary effort is normal.     Breath sounds: Normal breath sounds.  Musculoskeletal:     Cervical back: Neck supple.  Skin:    General: Skin is warm and dry.  Neurological:     Mental Status: He is alert and oriented to person, place, and time.     Depression screen [facility] 2/9 09/21/2021 03/17/2021 06/13/2020 09/28/2019 08/02/2019  Decreased Interest 3 0 0 0 0  Down, Depressed, Hopeless 3 0 0 0 0  PHQ - 2 Score 6 0 0 0 0  Altered sleeping 3 - - - -  Tired, decreased energy 3 - - - -  Change in appetite 3 - - - -  Feeling bad or failure about yourself  3 - - - -  Trouble concentrating 3 - - - -  Moving slowly or fidgety/restless 0 - - - -  Suicidal thoughts 0 - - - -  PHQ-9 Score 21 - - - -       Assessment & Plan:    Patient Active Problem List   Diagnosis Date Noted   Screening for STDs (sexually transmitted diseases) 08/02/2019   Facial rash 05/03/2019   Healthcare maintenance 05/03/2019   Tongue coating 05/03/2019   AIDS (acquired immune deficiency syndrome) ([place]) 04/03/2019   RASH AND OTHER NONSPECIFIC SKIN ERUPTION 09/26/2007   PENILE LESION 08/28/2007   COUGH 08/28/2007   TOBACCO USER 01/24/2007   HUMAN PAPILLOMAVIRUS 12/26/2006   HEPATITIS B, HX OF 12/26/2006   HIV DISEASE 11/25/2004     Problem List Items Addressed This Visit       Other   COUGH    [name] to have a cough associated with sinus pain, sinus pressure, and congestion.  He was treated with doxycycline for proctitis and chlamydia prior.  Given worsening of symptoms  will treat with 500 mg levofloxacin daily.  If symptoms worsen or do not improve suspect this would likely be due to inflammation and may need alternative medication for symptom relief.      HIV DISEASE    [name] to have well-controlled virus with good adherence and tolerance to his ART regimen of Biktarvy.  No signs/symptoms of opportunistic infection.  We reviewed lab work and discussed plan of care.  He will need to renew financial assistance after January 1.  Continue current dose of Biktarvy.  Plan for follow-up in 6 months or sooner if needed with lab work 1 to 2 weeks prior to appointment.      Relevant Medications   bictegravir-emtricitabine-tenofovir AF (BIKTARVY) 50-200-25 MG TABS  tablet   Other Relevant Orders   T-helper cell (CD4)- ([facility] clinic only)   HIV-1 RNA quant-no reflex-bld   Comprehensive metabolic panel   Healthcare maintenance    Discussed importance of safe sexual practice and condom usage.  Condoms offered. Vaccines discussed and declined.        I have discontinued Braedyn Drier's fluticasone and benzonatate. I am also having him start on levofloxacin. Additionally, I am having him maintain his bictegravir-emtricitabine-tenofovir AF.   Meds ordered this encounter  Medications   bictegravir-emtricitabine-tenofovir AF (BIKTARVY) 50-200-25 MG TABS tablet    Sig: Take 1 tablet by mouth daily.    Dispense:  30 tablet    Refill:  5    Order Specific Question:   Supervising Provider    Answer:   Baxter Flattery, CYNTHIA [[id number]]   levofloxacin (LEVAQUIN) 500 MG tablet    Sig: Take 1 tablet (500 mg total) by mouth daily.    Dispense:  10 tablet    Refill:  0    Order Specific Question:   Supervising Provider    Answer:   Carlyle Basques [[id number]]     Follow-up: Return in about 6 months (around 03/21/2022), or if symptoms worsen or fail to improve.   Terri Piedra, MSN, FNP-C Nurse Practitioner [facility] for Infectious Disease [place] number: [phone]

## 2021-09-21 NOTE — Patient Instructions (Addendum)
Nice to see you.  Continue to take your medication daily as prescribed.  Refills have been sent to the pharmacy.  Plan to renew your financial assistance after January 1st.   Plan for follow up in 6 month or sooner if needed with lab work 1-2 weeks prior to appointment.   Have a great day and stay safe!

## 2021-09-21 NOTE — Assessment & Plan Note (Signed)
Mr. Bogosian continues to have a cough associated with sinus pain, sinus pressure, and congestion.  He was treated with doxycycline for proctitis and chlamydia prior.  Given worsening of symptoms will treat with 500 mg levofloxacin daily.  If symptoms worsen or do not improve suspect this would likely be due to inflammation and may need alternative medication for symptom relief.

## 2021-09-21 NOTE — Assessment & Plan Note (Signed)
Rickey Jones continues to have well-controlled virus with good adherence and tolerance to his ART regimen of Biktarvy.  No signs/symptoms of opportunistic infection.  We reviewed lab work and discussed plan of care.  He will need to renew financial assistance after January 1.  Continue current dose of Biktarvy.  Plan for follow-up in 6 months or sooner if needed with lab work 1 to 2 weeks prior to appointment.

## 2022-02-22 ENCOUNTER — Other Ambulatory Visit: Payer: Self-pay

## 2022-02-23 ENCOUNTER — Other Ambulatory Visit: Payer: Self-pay

## 2022-02-23 ENCOUNTER — Other Ambulatory Visit: Payer: 59

## 2022-02-23 DIAGNOSIS — B20 Human immunodeficiency virus [HIV] disease: Secondary | ICD-10-CM

## 2022-02-24 LAB — T-HELPER CELL (CD4) - (RCID CLINIC ONLY)
CD4 % Helper T Cell: 26 % — ABNORMAL LOW (ref 33–65)
CD4 T Cell Abs: 534 /uL (ref 400–1790)

## 2022-02-27 LAB — HIV-1 RNA QUANT-NO REFLEX-BLD
HIV 1 RNA Quant: <20 copies/mL — AB
HIV-1 RNA Quant, Log: <1.3 Log copies/mL — AB

## 2022-02-27 LAB — COMPREHENSIVE METABOLIC PANEL
AG Ratio: 2.2 (calc) (ref 1.0–2.5)
ALT: 23 U/L (ref 9–46)
AST: 20 U/L (ref 10–40)
Albumin: 4.1 g/dL (ref 3.6–5.1)
Alkaline phosphatase (APISO): 66 U/L (ref 36–130)
BUN: 19 mg/dL (ref 7–25)
CO2: 28 mmol/L (ref 20–32)
Calcium: 9.2 mg/dL (ref 8.6–10.3)
Chloride: 106 mmol/L (ref 98–110)
Creat: 1.13 mg/dL (ref 0.60–1.29)
Globulin: 1.9 g/dL (calc) (ref 1.9–3.7)
Glucose, Bld: 116 mg/dL — ABNORMAL HIGH (ref 65–99)
Potassium: 4.5 mmol/L (ref 3.5–5.3)
Sodium: 140 mmol/L (ref 135–146)
Total Bilirubin: 0.3 mg/dL (ref 0.2–1.2)
Total Protein: 6 g/dL — ABNORMAL LOW (ref 6.1–8.1)

## 2022-03-08 ENCOUNTER — Encounter: Payer: Self-pay | Admitting: Family

## 2022-03-08 ENCOUNTER — Other Ambulatory Visit: Payer: Self-pay

## 2022-03-08 ENCOUNTER — Ambulatory Visit (INDEPENDENT_AMBULATORY_CARE_PROVIDER_SITE_OTHER): Payer: 59 | Admitting: Family

## 2022-03-08 VITALS — BP 118/76 | HR 73 | Temp 98.1°F | Wt 179.0 lb

## 2022-03-08 DIAGNOSIS — B20 Human immunodeficiency virus [HIV] disease: Secondary | ICD-10-CM

## 2022-03-08 DIAGNOSIS — Z0000 Encounter for general adult medical examination without abnormal findings: Secondary | ICD-10-CM

## 2022-03-08 MED ORDER — BICTEGRAVIR-EMTRICITAB-TENOFOV 50-200-25 MG PO TABS: 1.0000 | ORAL_TABLET | Freq: Every day | ORAL | 5 refills | Status: DC

## 2022-03-08 NOTE — Assessment & Plan Note (Signed)
?   Discussed importance of safe sexual practices and condom use.  Condoms offered. ?? Routine dental care up-to-date per recommendations. ?

## 2022-03-08 NOTE — Patient Instructions (Addendum)
Nice to see you.  We will check your lab work today.  Continue to take your medication daily as prescribed.  Refills have been sent to the pharmacy.  Plan for follow up in 6 months or sooner if needed with lab work 1-2 weeks prior to appointment.   Have a great day and stay safe!  

## 2022-03-08 NOTE — Assessment & Plan Note (Signed)
Rickey Jones continues to have well-controlled virus with good adherence and tolerance to his ART regimen of Biktarvy.  We reviewed lab work and discussed plan of care.  Financial assistance up-to-date.  Continue current dose of Biktarvy.  Plan for follow-up in 6 months or sooner if needed with lab work 1 to 2 weeks prior to appointment. ?

## 2022-03-08 NOTE — Progress Notes (Signed)
? ? ?Brief Narrative  ? ?Patient ID: Rickey Jones, male    DOB: 08/01/1976, 46 y.o.   MRN: [id number] ? ?  ?[name] is a 46 y/o Jones initially diagnosed with HIV around 2003 with risk factor of MSM. Initial viral load and CD4 count are unknown. History of thrush. Genotype from 04/03/19 with no medication resistance mutations. [id number] negative. Previous ART experience with [facility], [employer], and now [employer].  ? ?Subjective:  ?  ?Chief Complaint  ?Patient presents with  ? Follow-up  ? ? ?HPI: ? ?Rickey Jones is a 46 y.o. male with HIV disease last seen on 09/21/2021 with well-controlled virus and good adherence and tolerance to his ART regimen of Biktarvy.  Viral load was undetectable with CD4 count of 396.  Kidney function, liver function, electrolytes within normal ranges.  Most recent lab work completed on 02/23/2022 with viral load that remains undetectable and CD4 count of 534.  Kidney function, liver function, electrolytes also remained normal.  Here today for routine follow-up. ? ?[name] to take [place] daily as prescribed with no adverse side effects.  Feeling well today with no new concerns/complaints. Denies fevers, chills, night sweats, headaches, changes in vision, neck pain/stiffness, nausea, diarrhea, vomiting, lesions or rashes. ? ?[name] has PCAP. Issue with getting medication has been resolved.  Denies feelings of being down, depressed, or hopeless recently.  Continues to smoke tobacco daily with no alcohol consumption or recreational/illicit drug use.  Condoms offered.  Routine dental care up-to-date per recommendations. Working full time as a Astronomer.  ? ? ?Allergies  ?Allergen Reactions  ? Cephalexin Hives  ? ? ? ? ?Outpatient Medications Prior to Visit  ?Medication Sig Dispense Refill  ? bictegravir-emtricitabine-tenofovir AF (BIKTARVY) 50-200-25 MG TABS tablet Take 1 tablet by mouth daily. 30 tablet 5  ? levofloxacin (LEVAQUIN) 500 MG tablet Take 1 tablet (500 mg  total) by mouth daily. (Patient not taking: Reported on 03/08/2022) 10 tablet 0  ? ?No facility-administered medications prior to visit.  ? ? ? ?Past Medical History:  ?Diagnosis Date  ? HIV (human immunodeficiency virus infection) ([place])   ? Shingles   ? Thrush of mouth and esophagus ([place])   ? ? ? ?History reviewed. No pertinent surgical history. ? ? ? ?Review of Systems  ?Constitutional:  Negative for appetite change, chills, fatigue, fever and unexpected weight change.  ?Eyes:  Negative for visual disturbance.  ?Respiratory:  Negative for cough, chest tightness, shortness of breath and wheezing.   ?Cardiovascular:  Negative for chest pain and leg swelling.  ?Gastrointestinal:  Negative for abdominal pain, constipation, diarrhea, nausea and vomiting.  ?Genitourinary:  Negative for dysuria, flank pain, frequency, genital sores, hematuria and urgency.  ?Skin:  Negative for rash.  ?Allergic/Immunologic: Negative for immunocompromised state.  ?Neurological:  Negative for dizziness and headaches.  ?   ?Objective:  ?  ?BP 118/76   Pulse 73   Temp 98.1 ?F (36.7 ?C) (Temporal)   Wt 179 lb (81.2 kg)   BMI 25.68 kg/m?  ?Nursing note and vital signs reviewed. ? ?Physical Exam ?Constitutional:   ?   General: Rickey is not in acute distress. ?   Appearance: Rickey is well-developed.  ?Eyes:  ?   Conjunctiva/sclera: Conjunctivae normal.  ?Cardiovascular:  ?   Rate and Rhythm: Normal rate and regular rhythm.  ?   Heart sounds: Normal heart sounds. No murmur heard. ?  No friction rub. No gallop.  ?Pulmonary:  ?   Effort: Pulmonary effort is normal.  No respiratory distress.  ?   Breath sounds: Normal breath sounds. No wheezing or rales.  ?Chest:  ?   Chest wall: No tenderness.  ?Abdominal:  ?   General: Bowel sounds are normal.  ?   Palpations: Abdomen is soft.  ?   Tenderness: There is no abdominal tenderness.  ?Musculoskeletal:  ?   Cervical back: Neck supple.  ?Lymphadenopathy:  ?   Cervical: No cervical adenopathy.  ?Skin: ?    General: Skin is warm and dry.  ?   Findings: No rash.  ?Neurological:  ?   Mental Status: Rickey is alert and oriented to person, place, and time.  ?Psychiatric:     ?   Behavior: Behavior normal.     ?   Thought Content: Thought content normal.     ?   Judgment: Judgment normal.  ? ? ? ? ?  03/08/2022  ? 10:09 AM 09/21/2021  ?  9:34 AM 03/17/2021  ?  9:43 AM 06/13/2020  ? 10:07 AM 09/28/2019  ?  9:57 AM  ?Depression screen PHQ 2/9  ?Decreased Interest 0 3 0 0 0  ?Down, Depressed, Hopeless 1 3 0 0 0  ?PHQ - 2 Score 1 6 0 0 0  ?Altered sleeping  3     ?Tired, decreased energy  3     ?Change in appetite  3     ?Feeling bad or failure about yourself   3     ?Trouble concentrating  3     ?Moving slowly or fidgety/restless  0     ?Suicidal thoughts  0     ?PHQ-9 Score  21     ?  ?   ?Assessment & Plan:  ? ? ?Patient Active Problem List  ? Diagnosis Date Noted  ? Screening for STDs (sexually transmitted diseases) 08/02/2019  ? Facial rash 05/03/2019  ? Healthcare maintenance 05/03/2019  ? Tongue coating 05/03/2019  ? AIDS (acquired immune deficiency syndrome) ([place]) 04/03/2019  ? RASH AND OTHER NONSPECIFIC SKIN ERUPTION 09/26/2007  ? PENILE LESION 08/28/2007  ? COUGH 08/28/2007  ? TOBACCO USER 01/24/2007  ? HUMAN PAPILLOMAVIRUS 12/26/2006  ? HEPATITIS B, HX OF 12/26/2006  ? HIV DISEASE 11/25/2004  ? ? ? ?Problem List Items Addressed This Visit   ? ?  ? Other  ? HIV DISEASE - Primary  ?  Rickey Jones continues to have well-controlled virus with good adherence and tolerance to his ART regimen of Biktarvy.  We reviewed lab work and discussed plan of care.  Financial assistance up-to-date.  Continue current dose of Biktarvy.  Plan for follow-up in 6 months or sooner if needed with lab work 1 to 2 weeks prior to appointment. ? ?  ?  ? Relevant Medications  ? bictegravir-emtricitabine-tenofovir AF (BIKTARVY) 50-200-25 MG TABS tablet  ? Healthcare maintenance  ?  Discussed importance of safe sexual practices and condom use.  Condoms  offered. ?Routine dental care up-to-date per recommendations. ?  ?  ? ? ? ?I have discontinued Miki Burdin's levofloxacin. I am also having him maintain his bictegravir-emtricitabine-tenofovir AF. ? ? ?Meds ordered this encounter  ?Medications  ? bictegravir-emtricitabine-tenofovir AF (BIKTARVY) 50-200-25 MG TABS tablet  ?  Sig: Take 1 tablet by mouth daily.  ?  Dispense:  30 tablet  ?  Refill:  5  ?  Order Specific Question:   Supervising Provider  ?  Answer:   Carlyle Basques [[id number]]  ? ? ? ?Follow-up: Return in about 6 months (around  09/08/2022), or if symptoms worsen or fail to improve. ? ? ?Terri Piedra, MSN, FNP-C ?Nurse Practitioner ?[place] for Infectious Disease ?[facility] ?[facility] Main number: [phone] ? ? ?

## 2022-05-11 ENCOUNTER — Other Ambulatory Visit: Payer: Self-pay

## 2022-05-11 ENCOUNTER — Ambulatory Visit: Payer: 59

## 2022-08-25 ENCOUNTER — Other Ambulatory Visit: Payer: Self-pay | Admitting: Family

## 2022-08-25 DIAGNOSIS — B20 Human immunodeficiency virus [HIV] disease: Secondary | ICD-10-CM

## 2022-09-07 ENCOUNTER — Other Ambulatory Visit: Payer: Self-pay

## 2022-09-07 ENCOUNTER — Other Ambulatory Visit (HOSPITAL_COMMUNITY)
Admission: RE | Admit: 2022-09-07 | Discharge: 2022-09-07 | Disposition: A | Discharge status: Home / Self Care | Payer: Commercial Managed Care - HMO | Source: Ambulatory Visit | Attending: Internal Medicine | Admitting: Internal Medicine

## 2022-09-07 ENCOUNTER — Other Ambulatory Visit: Payer: Commercial Managed Care - HMO

## 2022-09-07 DIAGNOSIS — B20 Human immunodeficiency virus [HIV] disease: Secondary | ICD-10-CM

## 2022-09-07 DIAGNOSIS — Z113 Encounter for screening for infections with a predominantly sexual mode of transmission: Secondary | ICD-10-CM | POA: Diagnosis present

## 2022-09-08 LAB — URINE CYTOLOGY ANCILLARY ONLY
Chlamydia: NEGATIVE
Comment: NEGATIVE
Comment: NORMAL
Neisseria Gonorrhea: NEGATIVE

## 2022-09-08 LAB — T-HELPER CELL (CD4) - (RCID CLINIC ONLY)
CD4 % Helper T Cell: 27 % — ABNORMAL LOW (ref 33–65)
CD4 T Cell Abs: 604 /uL (ref 400–1790)

## 2022-09-10 LAB — CBC WITH DIFFERENTIAL/PLATELET
Absolute Monocytes: 605 cells/uL (ref 200–950)
Basophils Absolute: 10 cells/uL (ref 0–200)
Basophils Relative: 0.2 %
Eosinophils Absolute: 62 cells/uL (ref 15–500)
Eosinophils Relative: 1.3 %
HCT: 43.7 % (ref 38.5–50.0)
Hemoglobin: 15 g/dL (ref 13.2–17.1)
Lymphs Abs: 2342 cells/uL (ref 850–3900)
MCH: 32.3 pg (ref 27.0–33.0)
MCHC: 34.3 g/dL (ref 32.0–36.0)
MCV: 94.2 fL (ref 80.0–100.0)
MPV: 11.2 fL (ref 7.5–12.5)
Monocytes Relative: 12.6 %
Neutro Abs: 1781 cells/uL (ref 1500–7800)
Neutrophils Relative %: 37.1 %
Platelets: 181 10*3/uL (ref 140–400)
RBC: 4.64 10*6/uL (ref 4.20–5.80)
RDW: 12.9 % (ref 11.0–15.0)
Total Lymphocyte: 48.8 %
WBC: 4.8 10*3/uL (ref 3.8–10.8)

## 2022-09-10 LAB — COMPLETE METABOLIC PANEL WITH GFR
AG Ratio: 2.1 (calc) (ref 1.0–2.5)
ALT: 24 U/L (ref 9–46)
AST: 16 U/L (ref 10–40)
Albumin: 4.2 g/dL (ref 3.6–5.1)
Alkaline phosphatase (APISO): 60 U/L (ref 36–130)
BUN: 19 mg/dL (ref 7–25)
CO2: 28 mmol/L (ref 20–32)
Calcium: 8.5 mg/dL — ABNORMAL LOW (ref 8.6–10.3)
Chloride: 109 mmol/L (ref 98–110)
Creat: 1.08 mg/dL (ref 0.60–1.29)
Globulin: 2 g/dL (calc) (ref 1.9–3.7)
Glucose, Bld: 100 mg/dL — ABNORMAL HIGH (ref 65–99)
Potassium: 4.6 mmol/L (ref 3.5–5.3)
Sodium: 141 mmol/L (ref 135–146)
Total Bilirubin: 0.3 mg/dL (ref 0.2–1.2)
Total Protein: 6.2 g/dL (ref 6.1–8.1)
eGFR: 87 mL/min/{1.73_m2} (ref >=60)

## 2022-09-10 LAB — FLUORESCENT TREPONEMAL AB(FTA)-IGG-BLD: Fluorescent Treponemal ABS: REACTIVE — AB

## 2022-09-10 LAB — HIV-1 RNA QUANT-NO REFLEX-BLD
HIV 1 RNA Quant: 50 Copies/mL — ABNORMAL HIGH
HIV-1 RNA Quant, Log: 1.7 Log cps/mL — ABNORMAL HIGH

## 2022-09-10 LAB — RPR: RPR Ser Ql: REACTIVE — AB

## 2022-09-10 LAB — RPR TITER: RPR Titer: 1:1 {titer} — ABNORMAL HIGH

## 2022-09-20 ENCOUNTER — Ambulatory Visit: Payer: Commercial Managed Care - HMO

## 2022-09-20 ENCOUNTER — Ambulatory Visit (INDEPENDENT_AMBULATORY_CARE_PROVIDER_SITE_OTHER): Payer: Commercial Managed Care - HMO | Admitting: Family

## 2022-09-20 ENCOUNTER — Other Ambulatory Visit: Payer: Self-pay

## 2022-09-20 ENCOUNTER — Encounter: Payer: Self-pay | Admitting: Family

## 2022-09-20 VITALS — BP 122/76 | HR 66 | Temp 98.1°F | Wt 184.0 lb

## 2022-09-20 DIAGNOSIS — Z79899 Other long term (current) drug therapy: Secondary | ICD-10-CM | POA: Diagnosis not present

## 2022-09-20 DIAGNOSIS — B20 Human immunodeficiency virus [HIV] disease: Secondary | ICD-10-CM

## 2022-09-20 DIAGNOSIS — Z0000 Encounter for general adult medical examination without abnormal findings: Secondary | ICD-10-CM | POA: Diagnosis not present

## 2022-09-20 DIAGNOSIS — Z113 Encounter for screening for infections with a predominantly sexual mode of transmission: Secondary | ICD-10-CM

## 2022-09-20 MED ORDER — BIKTARVY 50-200-25 MG PO TABS: 1.0000 | ORAL_TABLET | Freq: Every day | ORAL | 6 refills | Status: DC

## 2022-09-20 NOTE — Patient Instructions (Signed)
Nice to see you.  Continue to take your medication daily as prescribed.  Refills have been sent to the pharmacy.  Plan for follow up in 6 months or sooner if needed with lab work 1-2 weeks prior to appointment.   Have a great day and stay safe!  

## 2022-09-20 NOTE — Progress Notes (Signed)
Brief Narrative   Patient ID: Rickey Jones, male    DOB: [birth year], 46 y.o.   MRN: [id number]  Rickey Jones is a 46 y/o gentleman initially diagnosed with HIV around 2003 with risk factor of MSM. Initial viral load and CD4 count are unknown. History of thrush. Genotype from 04/03/19 with no medication resistance mutations. [id number] negative. Previous ART experience with [employer], [employer], and now [employer].    Subjective:    Chief Complaint  Patient presents with   Follow-up   HIV Positive/AIDS    HPI:  Rickey Jones is a 46 y.o. male with HIV disease last seen on 03/08/22 with well controlled virus and good adherence and tolerance to Biktarvy.  Viral load was undetectable with CD4 count of 534.  Kidney function, liver function, electrolytes within normal ranges.  RPR titer of 1: 2.  Most recent lab work completed on 09/07/2022 with viral load of 50 and CD4 count of 604.  RPR titer down to 1: 1.  Urine was negative for gonorrhea and chlamydia.  Kidney function, liver function, electrolytes remained normal.  Here today for routine follow-up.  Rickey Jones continues to take his Rickey Jones as prescribed with no adverse side effects and no problems obtaining medication from the pharmacy and has been doing well since his last office visit.  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  Just got over a streak of 11 straight days of work.  And has seen good improvements in his income.  Condoms and STD testing offered.  Due for routine dental care.  Requesting COVID vaccination.  Denies fevers, chills, night sweats, headaches, changes in vision, neck pain/stiffness, nausea, diarrhea, vomiting, lesions or rashes.   Allergies  Allergen Reactions   Cephalexin Hives      Outpatient Medications Prior to Visit  Medication Sig Dispense Refill   BIKTARVY 50-200-25 MG TABS tablet TAKE 1 TABLET BY MOUTH DAILY 30 tablet 1   No facility-administered medications prior to visit.     Past Medical History:   Diagnosis Date   HIV (human immunodeficiency virus infection) ([place])    Shingles    Thrush of mouth and esophagus ([place])      History reviewed. No pertinent surgical history.    Review of Systems  Constitutional:  Negative for appetite change, chills, fatigue, fever and unexpected weight change.  Eyes:  Negative for visual disturbance.  Respiratory:  Negative for cough, chest tightness, shortness of breath and wheezing.   Cardiovascular:  Negative for chest pain and leg swelling.  Gastrointestinal:  Negative for abdominal pain, constipation, diarrhea, nausea and vomiting.  Genitourinary:  Negative for dysuria, flank pain, frequency, genital sores, hematuria and urgency.  Skin:  Negative for rash.  Allergic/Immunologic: Negative for immunocompromised state.  Neurological:  Negative for dizziness and headaches.      Objective:    BP 122/76   Pulse 66   Temp 98.1 F (36.7 C) (Oral)   Wt 184 lb (83.5 kg)   SpO2 99%   BMI 26.40 kg/m  Nursing note and vital signs reviewed.  Physical Exam Constitutional:      General: He is not in acute distress.    Appearance: He is well-developed.  Eyes:     Conjunctiva/sclera: Conjunctivae normal.  Cardiovascular:     Rate and Rhythm: Normal rate and regular rhythm.     Heart sounds: Normal heart sounds. No murmur heard.    No friction rub. No gallop.  Pulmonary:     Effort: Pulmonary effort is normal.  No respiratory distress.     Breath sounds: Normal breath sounds. No wheezing or rales.  Chest:     Chest wall: No tenderness.  Abdominal:     General: Bowel sounds are normal.     Palpations: Abdomen is soft.     Tenderness: There is no abdominal tenderness.  Musculoskeletal:     Cervical back: Neck supple.  Lymphadenopathy:     Cervical: No cervical adenopathy.  Skin:    General: Skin is warm and dry.     Findings: No rash.  Neurological:     Mental Status: He is alert and oriented to person, place, and time.  Psychiatric:         Behavior: Behavior normal.        Thought Content: Thought content normal.        Judgment: Judgment normal.         03/08/2022   10:09 AM 09/21/2021    9:34 AM 03/17/2021    9:43 AM 06/13/2020   10:07 AM 09/28/2019    9:57 AM  Depression screen PHQ 2/9  Decreased Interest 0 3 0 0 0  Down, Depressed, Hopeless 1 3 0 0 0  PHQ - 2 Score 1 6 0 0 0  Altered sleeping  3     Tired, decreased energy  3     Change in appetite  3     Feeling bad or failure about yourself   3     Trouble concentrating  3     Moving slowly or fidgety/restless  0     Suicidal thoughts  0     PHQ-9 Score  21          Assessment & Plan:    Patient Active Problem List   Diagnosis Date Noted   Screening for STDs (sexually transmitted diseases) 08/02/2019   Facial rash 05/03/2019   Healthcare maintenance 05/03/2019   Tongue coating 05/03/2019   AIDS (acquired immune deficiency syndrome) ([place]) 04/03/2019   RASH AND OTHER NONSPECIFIC SKIN ERUPTION 09/26/2007   PENILE LESION 08/28/2007   COUGH 08/28/2007   TOBACCO USER 01/24/2007   HUMAN PAPILLOMAVIRUS 12/26/2006   HEPATITIS B, HX OF 12/26/2006   HIV DISEASE 11/25/2004     Problem List Items Addressed This Visit       Other   HIV DISEASE - Primary    Rickey Jones continues to have well-controlled virus with good adherence and tolerance to [employer].  Reviewed lab work and discussed plan of care.  Continue current dose of Biktarvy.  Plan for follow-up in 6 months or sooner if needed with lab work 1 to 2 weeks prior to appointment.      Relevant Medications   bictegravir-emtricitabine-tenofovir AF (BIKTARVY) 50-200-25 MG TABS tablet   Other Relevant Orders   T-helper cell (CD4)- ([facility] clinic only)   HIV-1 RNA quant-no reflex-bld   Comprehensive metabolic panel   Healthcare maintenance    Discussed importance of safe sexual practice and condom use. Condoms and STD testing offered.  COVID-vaccine updated. Will schedule routine dental care  independently and can refer to dental clinic if necessary.      Screening for STDs (sexually transmitted diseases)   Relevant Orders   RPR   Other Visit Diagnoses     Pharmacologic therapy       Relevant Orders   Lipid panel        I have changed Rickey Jones.   Meds ordered this encounter  Medications   bictegravir-emtricitabine-tenofovir  AF (BIKTARVY) 50-200-25 MG TABS tablet    Sig: Take 1 tablet by mouth daily.    Dispense:  30 tablet    Refill:  6    Order Specific Question:   Supervising Provider    Answer:   Carlyle Basques [[id number]]     Follow-up: Return in about 6 months (around 03/21/2023), or if symptoms worsen or fail to improve.   Rickey Piedra, MSN, FNP-C Nurse Practitioner [facility] for [facility] Disease [place] number: [phone]

## 2022-09-20 NOTE — Assessment & Plan Note (Signed)
Discussed importance of safe sexual practice and condom use. Condoms and STD testing offered.  COVID-vaccine updated. Will schedule routine dental care independently and can refer to dental clinic if necessary.

## 2022-09-20 NOTE — Assessment & Plan Note (Signed)
Rickey Jones continues to have well-controlled virus with good adherence and tolerance to [employer].  Reviewed lab work and discussed plan of care.  Continue current dose of Biktarvy.  Plan for follow-up in 6 months or sooner if needed with lab work 1 to 2 weeks prior to appointment.

## 2023-03-07 ENCOUNTER — Other Ambulatory Visit: Payer: Self-pay

## 2023-03-07 ENCOUNTER — Other Ambulatory Visit: Payer: Commercial Managed Care - HMO

## 2023-03-07 DIAGNOSIS — Z79899 Other long term (current) drug therapy: Secondary | ICD-10-CM

## 2023-03-07 DIAGNOSIS — Z113 Encounter for screening for infections with a predominantly sexual mode of transmission: Secondary | ICD-10-CM

## 2023-03-07 DIAGNOSIS — B20 Human immunodeficiency virus [HIV] disease: Secondary | ICD-10-CM

## 2023-03-08 LAB — T-HELPER CELL (CD4) - (RCID CLINIC ONLY)
CD4 % Helper T Cell: 27 % — ABNORMAL LOW (ref 33–65)
CD4 T Cell Abs: 595 /uL (ref 400–1790)

## 2023-03-09 LAB — T PALLIDUM AB: T Pallidum Abs: POSITIVE — AB

## 2023-03-11 LAB — COMPREHENSIVE METABOLIC PANEL
AG Ratio: 1.9 (calc) (ref 1.0–2.5)
ALT: 25 U/L (ref 9–46)
AST: 18 U/L (ref 10–40)
Albumin: 4.3 g/dL (ref 3.6–5.1)
Alkaline phosphatase (APISO): 62 U/L (ref 36–130)
BUN: 15 mg/dL (ref 7–25)
CO2: 26 mmol/L (ref 20–32)
Calcium: 9.5 mg/dL (ref 8.6–10.3)
Chloride: 105 mmol/L (ref 98–110)
Creat: 1.03 mg/dL (ref 0.60–1.29)
Globulin: 2.3 g/dL (calc) (ref 1.9–3.7)
Glucose, Bld: 111 mg/dL — ABNORMAL HIGH (ref 65–99)
Potassium: 4.4 mmol/L (ref 3.5–5.3)
Sodium: 139 mmol/L (ref 135–146)
Total Bilirubin: 0.4 mg/dL (ref 0.2–1.2)
Total Protein: 6.6 g/dL (ref 6.1–8.1)

## 2023-03-11 LAB — LIPID PANEL
Cholesterol: 151 mg/dL (ref <200)
HDL: 34 mg/dL — ABNORMAL LOW (ref >=40)
LDL Cholesterol (Calc): 82 mg/dL (calc)
Non-HDL Cholesterol (Calc): 117 mg/dL (calc) (ref <130)
Total CHOL/HDL Ratio: 4.4 (calc) (ref <5.0)
Triglycerides: 269 mg/dL — ABNORMAL HIGH (ref <150)

## 2023-03-11 LAB — RPR: RPR Ser Ql: REACTIVE — AB

## 2023-03-11 LAB — HIV-1 RNA QUANT-NO REFLEX-BLD
HIV 1 RNA Quant: 30 Copies/mL — ABNORMAL HIGH
HIV-1 RNA Quant, Log: 1.47 Log cps/mL — ABNORMAL HIGH

## 2023-03-11 LAB — RPR TITER: RPR Titer: 1:1 {titer} — ABNORMAL HIGH

## 2023-04-04 ENCOUNTER — Encounter: Payer: Self-pay | Admitting: Family

## 2023-04-04 ENCOUNTER — Ambulatory Visit (INDEPENDENT_AMBULATORY_CARE_PROVIDER_SITE_OTHER): Payer: Commercial Managed Care - HMO | Admitting: Family

## 2023-04-04 ENCOUNTER — Other Ambulatory Visit: Payer: Self-pay

## 2023-04-04 VITALS — BP 121/76 | HR 72 | Temp 97.6°F | Ht 70.0 in | Wt 194.0 lb

## 2023-04-04 DIAGNOSIS — B20 Human immunodeficiency virus [HIV] disease: Secondary | ICD-10-CM

## 2023-04-04 DIAGNOSIS — F17200 Nicotine dependence, unspecified, uncomplicated: Secondary | ICD-10-CM

## 2023-04-04 DIAGNOSIS — F17210 Nicotine dependence, cigarettes, uncomplicated: Secondary | ICD-10-CM

## 2023-04-04 DIAGNOSIS — Z9189 Other specified personal risk factors, not elsewhere classified: Secondary | ICD-10-CM

## 2023-04-04 DIAGNOSIS — Z0000 Encounter for general adult medical examination without abnormal findings: Secondary | ICD-10-CM

## 2023-04-04 MED ORDER — BIKTARVY 50-200-25 MG PO TABS
1.0000 | ORAL_TABLET | Freq: Every day | ORAL | 6 refills | Status: DC
Start: 2023-04-04 — End: 2023-09-26

## 2023-04-04 NOTE — Assessment & Plan Note (Signed)
Mr. Don continues to have well-controlled virus with good adherence and tolerance to [employer].  Reviewed lab work and discussed plan of care as well as U equals U and family-planning.  Continue current dose of Biktarvy.  Renew financial assistance after July 1.  Plan for follow-up in 6 months or sooner if needed with lab work 1 to 2 weeks prior to appointment.

## 2023-04-04 NOTE — Assessment & Plan Note (Signed)
Mr. Willcutt continues to smoke daily.  Discussed importance of tobacco cessation reduce risk of cardiovascular, renal, respiratory, and malignant disease in the future.  He is in the precontemplation stage of quitting and not ready to quit at this time.

## 2023-04-04 NOTE — Progress Notes (Signed)
Brief Narrative   Patient ID: Rickey Jones, male    DOB: 07-22-1976, 47 y.o.   MRN: [id number]  Rickey Jones is a 47 y/o gentleman initially diagnosed with HIV around 2003 with risk factor of MSM. Initial viral load and CD4 count are unknown. History of thrush. Genotype from 04/03/19 with no medication resistance mutations. [id number] negative. Previous ART experience with [employer], [employer], and now [employer].    Subjective:    Chief Complaint  Patient presents with   Follow-up   HIV Positive/AIDS    HPI:  Rickey Jones is a 47 y.o. male with HIV disease last seen on 09/20/2022 with well-controlled virus and good adherence and tolerance to [employer].  Viral load was undetectable with CD4 count 604.  Most recent lab work completed on 03/07/2023 with viral load that remains undetectable and CD4 count 595.  Kidney function, liver function, electrolytes within normal ranges.  Lipid profile with triglycerides 269, LDL 82, and HDL 34.  Calculated ASCVD risk of 4.9%. Here today for follow up.   Rickey Jones been doing well since his last office visit and continues to take [facility] with no adverse side effects or problems obtaining medication.  Reviewed lab work.  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 Condoms and STD testing offered.  Declines vaccinations.  Continues to smoke tobacco.  Due for routine dental care and for colon cancer screening.  Denies fevers, chills, night sweats, headaches, changes in vision, neck pain/stiffness, nausea, diarrhea, vomiting, lesions or rashes.   Allergies  Allergen Reactions   Cephalexin Hives      Outpatient Medications Prior to Visit  Medication Sig Dispense Refill   bictegravir-emtricitabine-tenofovir AF (BIKTARVY) 50-200-25 MG TABS tablet Take 1 tablet by mouth daily. 30 tablet 6   No facility-administered medications prior to visit.     Past Medical History:  Diagnosis Date   HIV (human immunodeficiency virus  infection) ([facility])    Shingles    Thrush of mouth and esophagus ([facility])      History reviewed. No pertinent surgical history.    Review of Systems  Constitutional:  Negative for appetite change, chills, fatigue, fever and unexpected weight change.  Eyes:  Negative for visual disturbance.  Respiratory:  Negative for cough, chest tightness, shortness of breath and wheezing.   Cardiovascular:  Negative for chest pain and leg swelling.  Gastrointestinal:  Negative for abdominal pain, constipation, diarrhea, nausea and vomiting.  Genitourinary:  Negative for dysuria, flank pain, frequency, genital sores, hematuria and urgency.  Skin:  Negative for rash.  Allergic/Immunologic: Negative for immunocompromised state.  Neurological:  Negative for dizziness and headaches.      Objective:    BP 121/76   Pulse 72   Temp 97.6 F (36.4 C) (Oral)   Ht 5\' 10"  (1.778 m)   Wt 194 lb (88 kg)   SpO2 96%   BMI 27.84 kg/m  Nursing note and vital signs reviewed.  Physical Exam Constitutional:      General: He is not in acute distress.    Appearance: He is well-developed.  Eyes:     Conjunctiva/sclera: Conjunctivae normal.  Cardiovascular:     Rate and Rhythm: Normal rate and regular rhythm.     Heart sounds: Normal heart sounds. No murmur heard.    No friction rub. No gallop.  Pulmonary:     Effort: Pulmonary effort is normal. No respiratory distress.     Breath sounds: Normal breath sounds. No wheezing or rales.  Chest:  Chest wall: No tenderness.  Abdominal:     General: Bowel sounds are normal.     Palpations: Abdomen is soft.     Tenderness: There is no abdominal tenderness.  Musculoskeletal:     Cervical back: Neck supple.  Lymphadenopathy:     Cervical: No cervical adenopathy.  Skin:    General: Skin is warm and dry.     Findings: No rash.  Neurological:     Mental Status: He is alert and oriented to person, place, and time.  Psychiatric:        Behavior: Behavior normal.         Thought Content: Thought content normal.        Judgment: Judgment normal.         04/04/2023   10:33 AM 03/08/2022   10:09 AM 09/21/2021    9:34 AM 03/17/2021    9:43 AM 06/13/2020   10:07 AM  Depression screen PHQ 2/9  Decreased Interest 0 0 3 0 0  Down, Depressed, Hopeless 0 1 3 0 0  PHQ - 2 Score 0 1 6 0 0  Altered sleeping   3    Tired, decreased energy   3    Change in appetite   3    Feeling bad or failure about yourself    3    Trouble concentrating   3    Moving slowly or fidgety/restless   0    Suicidal thoughts   0    PHQ-9 Score   21         Assessment & Plan:    Patient Active Problem List   Diagnosis Date Noted   Screening for STDs (sexually transmitted diseases) 08/02/2019   Facial rash 05/03/2019   Healthcare maintenance 05/03/2019   Tongue coating 05/03/2019   AIDS (acquired immune deficiency syndrome) ([facility]) 04/03/2019   RASH AND OTHER NONSPECIFIC SKIN ERUPTION 09/26/2007   PENILE LESION 08/28/2007   COUGH 08/28/2007   TOBACCO USER 01/24/2007   HUMAN PAPILLOMAVIRUS 12/26/2006   HEPATITIS B, HX OF 12/26/2006   HIV DISEASE 11/25/2004     Problem List Items Addressed This Visit       Other   TOBACCO USER    Rickey Jones continues to smoke daily.  Discussed importance of tobacco cessation reduce risk of cardiovascular, renal, respiratory, and malignant disease in the future.  He is in the precontemplation stage of quitting and not ready to quit at this time.      HIV DISEASE    Rickey Jones continues to have well-controlled virus with good adherence and tolerance to [employer].  Reviewed lab work and discussed plan of care as well as U equals U and family-planning.  Continue current dose of Biktarvy.  Renew financial assistance after July 1.  Plan for follow-up in 6 months or sooner if needed with lab work 1 to 2 weeks prior to appointment.      Relevant Medications   bictegravir-emtricitabine-tenofovir AF (BIKTARVY) 50-200-25 MG TABS tablet    Healthcare maintenance    Discussed importance of safe sexual practice and condom use. Condoms and STD testing offered.  Declines vaccinations Refer to GI for colonoscopy Will schedule routine dental care.       Other Visit Diagnoses     At risk for colon cancer    -  Primary   Relevant Orders   Ambulatory referral to Gastroenterology        I am having Rickey Jones maintain his [place].  Meds ordered this encounter  Medications   bictegravir-emtricitabine-tenofovir AF (BIKTARVY) 50-200-25 MG TABS tablet    Sig: Take 1 tablet by mouth daily.    Dispense:  30 tablet    Refill:  6    Order Specific Question:   Supervising Provider    Answer:   Judyann Munson [[id number]]     Follow-up: Return in about 6 months (around 10/04/2023).   Marcos Eke, MSN, FNP-C Nurse Practitioner [facility] Main number: [phone]

## 2023-04-04 NOTE — Patient Instructions (Addendum)
Nice to see you.  Continue to take your medication daily as prescribed.  Refills have been sent to the pharmacy.  Renew ICAP when appropriate.   Plan for follow up in 6 months or sooner if needed with lab work 1-2 weeks prior to appointment.   Have a great day and stay safe!

## 2023-04-04 NOTE — Assessment & Plan Note (Signed)
Discussed importance of safe sexual practice and condom use. Condoms and STD testing offered.  Declines vaccinations Refer to GI for colonoscopy Will schedule routine dental care.

## 2023-05-09 ENCOUNTER — Other Ambulatory Visit (HOSPITAL_COMMUNITY): Payer: Self-pay

## 2023-05-10 ENCOUNTER — Ambulatory Visit: Payer: Commercial Managed Care - HMO

## 2023-05-10 ENCOUNTER — Other Ambulatory Visit: Payer: Self-pay

## 2023-07-05 ENCOUNTER — Encounter: Payer: Self-pay | Admitting: Gastroenterology

## 2023-07-05 ENCOUNTER — Ambulatory Visit: Payer: Commercial Managed Care - HMO | Admitting: Gastroenterology

## 2023-07-05 VITALS — BP 110/70 | HR 84 | Ht 69.0 in | Wt 194.1 lb

## 2023-07-05 DIAGNOSIS — Z1212 Encounter for screening for malignant neoplasm of rectum: Secondary | ICD-10-CM | POA: Diagnosis not present

## 2023-07-05 DIAGNOSIS — Z1211 Encounter for screening for malignant neoplasm of colon: Secondary | ICD-10-CM | POA: Diagnosis not present

## 2023-07-05 NOTE — Patient Instructions (Addendum)
_______________________________________________________  If your blood pressure at your visit was 140/90 or greater, please contact your primary care physician to follow up on this.  _______________________________________________________  If you are age 45 or older, your body mass index should be between 23-30. Your Body mass index is 28.67 kg/m. If this is out of the aforementioned range listed, please consider follow up with your Primary Care Provider.  If you are age 77 or younger, your body mass index should be between 19-25. Your Body mass index is 28.67 kg/m. If this is out of the aformentioned range listed, please consider follow up with your Primary Care Provider.   ________________________________________________________  The [facility] providers would like to encourage you to use [facility] to communicate with providers for non-urgent requests or questions.  Due to long hold times on the telephone, sending your provider a message by [facility] may be a faster and more efficient way to get a response.  Please allow 48 business hours for a response.  Please remember that this is for non-urgent requests.  _______________________________________________________  Bonita Quin have been scheduled for a colonoscopy. Please follow written instructions given to you at your visit today.   We have given you samples of the following medication to take: Clenpiq  Please pick up your prep supplies at the pharmacy within the next 1-3 days.  If you use inhalers (even only as needed), please bring them with you on the day of your procedure.  DO NOT TAKE 7 DAYS PRIOR TO TEST- Trulicity (dulaglutide) Ozempic, Wegovy (semaglutide) Mounjaro (tirzepatide) Bydureon Bcise (exanatide extended release)  DO NOT TAKE 1 DAY PRIOR TO YOUR TEST Rybelsus (semaglutide) Adlyxin (lixisenatide) Victoza (liraglutide) Byetta (exanatide) ___________________________________________________________________________  Thank  you,  Dr. Lynann Bologna

## 2023-07-05 NOTE — Progress Notes (Signed)
Chief Complaint: For screening colonoscopy.  Referring Provider:  Veryl Speak, FNP      ASSESSMENT AND PLAN;   #1. CRC screening  #2. ? Int hoids vs anorectal HPV  Plan: -Colon for further evaluation -Further workup thereafter  Discussed risks & benefits of colonoscopy. Risks including rare perforation req laparotomy, bleeding after bx/polypectomy req blood transfusion, rarely missing neoplasms, risks of anesthesia/sedation, rare risk of damage to internal organs. Benefits outweigh the risks. Patient agrees to proceed. All the questions were answered. Pt consents to proceed. HPI:    Rickey Jones is a 47 y.o. male  With HIV (on Biktarvy), follwed by ID, undetectable viral load Here for CRC screening  Wanted to discuss before proceeding. Also with history of HPV-self treated.  I did rectal exam today.  2 small lesions in the anal canal- ?Internal hemorrhoids vs HPV.  No obvious external lesions.  No GI complaints except alternating diarrhea and constipation.  Rare abdominal bloating.  No melena or hematochezia.  Denies having any upper GI symptoms including heartburn, regurgitation, odynophagia or dysphagia.  No family history of colon cancer or colon polyps.   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 Stress and depression causing some issues.    Vaping Use   Vaping status: Never Used  Substance Use Topics   Alcohol use: Not Currently    Alcohol/week: 1.0 standard drink of alcohol    Types: 1 Standard drinks or equivalent per week    Comment: occasional   Drug use: Not Currently    Types: Marijuana    Comment: occassional;     Current Outpatient Medications  Medication Sig Dispense Refill   bictegravir-emtricitabine-tenofovir AF (BIKTARVY) 50-200-25 MG TABS tablet Take 1 tablet by mouth daily. 30 tablet 6   No current facility-administered medications for this visit.    Allergies  Allergen Reactions   Keflex [Cephalexin] Hives    Review of Systems:  Constitutional: Denies fever, chills, diaphoresis, appetite change and fatigue.  HEENT: Denies photophobia, eye pain, redness, hearing loss, ear pain, congestion, sore throat, rhinorrhea, sneezing, mouth sores, neck pain, neck stiffness and tinnitus.   Respiratory: Denies SOB, DOE, cough, chest tightness,  and wheezing.   Cardiovascular: Denies chest pain, palpitations and leg swelling.  Genitourinary: Denies dysuria, urgency, frequency, hematuria, flank pain and difficulty urinating.  Musculoskeletal: Denies myalgias, back pain, joint swelling, arthralgias and gait problem.  Skin: No rash.  Neurological: Denies dizziness, seizures, syncope, weakness, light-headedness, numbness and headaches.  Hematological: Denies adenopathy. Easy bruising, personal or family bleeding history  Psychiatric/Behavioral: No anxiety or depression     Physical Exam:    BP 110/70 (BP Location: Left Arm, Patient Position:  Sitting, Cuff Size: Normal)   Pulse 84   Ht 5\' 9"  (1.753 m) Comment: height measured without shoes  Wt 194 lb 2 oz (88.1 kg)   BMI 28.67 kg/m  Wt Readings from Last 3 Encounters:  07/05/23 194 lb 2 oz (88.1 kg)  04/04/23 194 lb (88 kg)  09/20/22 184 lb (83.5 kg)   Constitutional:  Well-developed, in no acute distress. Psychiatric: Normal mood and affect. Behavior is  normal. HEENT: Pupils normal.  Conjunctivae are normal. No scleral icterus. Cardiovascular: Normal rate, regular rhythm. No edema Pulmonary/chest: Effort normal and breath sounds normal. No wheezing, rales or rhonchi. Abdominal: Soft, nondistended. Nontender. Bowel sounds active throughout. There are no masses palpable. No hepatomegaly. Rectal: In presence of [place], no external lesions, good rectal tone.  2 small anorectal lesions palpated-internal hemorrhoids versus HPV. Neurological: Alert and oriented to person place and time. Skin: Skin is warm and dry. No rashes noted.  Data Reviewed: I have personally reviewed following labs and imaging studies  CBC:    Latest Ref Rng & Units 09/07/2022   10:40 AM 11/30/2019    9:50 AM 07/19/2019    9:51 AM  CBC  WBC 3.8 - 10.8 Thousand/uL 4.8  3.8  3.4   Hemoglobin 13.2 - 17.1 g/dL 16.1  09.6  04.5   Hematocrit 38.5 - 50.0 % 43.7  46.5  44.3   Platelets 140 - 400 Thousand/uL 181  169  162     CMP:    Latest Ref Rng & Units 03/07/2023    9:27 AM 09/07/2022   10:40 AM 02/23/2022   10:08 AM  CMP  Glucose 65 - 99 mg/dL 409  811  914   BUN 7 - 25 mg/dL 15  19  19    Creatinine 0.60 - 1.29 mg/dL 7.82  9.56  2.13   Sodium 135 - 146 mmol/L 139  141  140   Potassium 3.5 - 5.3 mmol/L 4.4  4.6  4.5   Chloride 98 - 110 mmol/L 105  109  106   CO2 20 - 32 mmol/L 26  28  28    Calcium 8.6 - 10.3 mg/dL 9.5  8.5  9.2   Total Protein 6.1 - 8.1 g/dL 6.6  6.2  6.0   Total Bilirubin 0.2 - 1.2 mg/dL 0.4  0.3  0.3   AST 10 - 40 U/L 18  16  20    ALT 9 - 46 U/L 25  24  23          Edman Circle, MD 07/05/2023, 10:16 AM  Cc: Veryl Speak, FNP

## 2023-07-13 ENCOUNTER — Encounter: Payer: Self-pay | Admitting: Gastroenterology

## 2023-07-26 ENCOUNTER — Telehealth: Payer: Self-pay | Admitting: Gastroenterology

## 2023-07-26 ENCOUNTER — Encounter: Payer: Commercial Managed Care - HMO | Admitting: Gastroenterology

## 2023-07-26 NOTE — Telephone Encounter (Signed)
Thanks for letting me know RG 

## 2023-07-26 NOTE — Telephone Encounter (Signed)
Good Morning Dr. Chales Abrahams,  This patient came in today for procedure. He did not have a care partner and he spoke with [place] and stated he would call back to reschedule.

## 2023-07-27 NOTE — Progress Notes (Signed)
The 10-year ASCVD risk score (Arnett DK, et al., 2019) is: 4.2%   Values used to calculate the score:     Age: 47 years     Sex: Male     Is Non-Hispanic African American: No     Diabetic: No     Tobacco smoker: Yes     Systolic Blood Pressure: 110 mmHg     Is BP treated: No     HDL Cholesterol: 34 mg/dL     Total Cholesterol: 151 mg/dL  Sandie Ano, RN

## 2023-09-12 ENCOUNTER — Other Ambulatory Visit: Payer: Self-pay

## 2023-09-12 ENCOUNTER — Other Ambulatory Visit: Payer: Managed Care, Other (non HMO)

## 2023-09-12 ENCOUNTER — Other Ambulatory Visit (HOSPITAL_COMMUNITY)
Admission: RE | Admit: 2023-09-12 | Discharge: 2023-09-12 | Disposition: A | Discharge status: Home / Self Care | Payer: Managed Care, Other (non HMO) | Source: Ambulatory Visit | Attending: Family | Admitting: Family

## 2023-09-12 DIAGNOSIS — B20 Human immunodeficiency virus [HIV] disease: Secondary | ICD-10-CM | POA: Insufficient documentation

## 2023-09-12 DIAGNOSIS — Z113 Encounter for screening for infections with a predominantly sexual mode of transmission: Secondary | ICD-10-CM | POA: Insufficient documentation

## 2023-09-13 LAB — URINE CYTOLOGY ANCILLARY ONLY
Chlamydia: NEGATIVE
Comment: NEGATIVE
Comment: NORMAL
Neisseria Gonorrhea: NEGATIVE

## 2023-09-14 LAB — COMPLETE METABOLIC PANEL WITH GFR
AG Ratio: 2 (calc) (ref 1.0–2.5)
ALT: 27 U/L (ref 9–46)
AST: 17 U/L (ref 10–40)
Albumin: 4.3 g/dL (ref 3.6–5.1)
Alkaline phosphatase (APISO): 72 U/L (ref 36–130)
BUN: 15 mg/dL (ref 7–25)
CO2: 27 mmol/L (ref 20–32)
Calcium: 9.1 mg/dL (ref 8.6–10.3)
Chloride: 103 mmol/L (ref 98–110)
Creat: 1.03 mg/dL (ref 0.60–1.29)
Globulin: 2.2 g/dL (ref 1.9–3.7)
Glucose, Bld: 108 mg/dL — ABNORMAL HIGH (ref 65–99)
Potassium: 4.4 mmol/L (ref 3.5–5.3)
Sodium: 139 mmol/L (ref 135–146)
Total Bilirubin: 0.3 mg/dL (ref 0.2–1.2)
Total Protein: 6.5 g/dL (ref 6.1–8.1)
eGFR: 91 mL/min/{1.73_m2} (ref >=60)

## 2023-09-14 LAB — CBC WITH DIFFERENTIAL/PLATELET
Absolute Lymphocytes: 3108 {cells}/uL (ref 850–3900)
Absolute Monocytes: 642 {cells}/uL (ref 200–950)
Basophils Absolute: 12 {cells}/uL (ref 0–200)
Basophils Relative: 0.2 %
Eosinophils Absolute: 78 {cells}/uL (ref 15–500)
Eosinophils Relative: 1.3 %
HCT: 43.6 % (ref 38.5–50.0)
Hemoglobin: 14.9 g/dL (ref 13.2–17.1)
MCH: 32.4 pg (ref 27.0–33.0)
MCHC: 34.2 g/dL (ref 32.0–36.0)
MCV: 94.8 fL (ref 80.0–100.0)
MPV: 11 fL (ref 7.5–12.5)
Monocytes Relative: 10.7 %
Neutro Abs: 2160 {cells}/uL (ref 1500–7800)
Neutrophils Relative %: 36 %
Platelets: 192 10*3/uL (ref 140–400)
RBC: 4.6 10*6/uL (ref 4.20–5.80)
RDW: 12.8 % (ref 11.0–15.0)
Total Lymphocyte: 51.8 %
WBC: 6 10*3/uL (ref 3.8–10.8)

## 2023-09-14 LAB — HIV-1 RNA QUANT-NO REFLEX-BLD
HIV 1 RNA Quant: NOT DETECTED {copies}/mL
HIV-1 RNA Quant, Log: NOT DETECTED {Log_copies}/mL

## 2023-09-14 LAB — RPR: RPR Ser Ql: REACTIVE — AB

## 2023-09-14 LAB — T PALLIDUM AB: T Pallidum Abs: POSITIVE — AB

## 2023-09-14 LAB — T-HELPER CELL (CD4) - (RCID CLINIC ONLY)
CD4 % Helper T Cell: 27 % — ABNORMAL LOW (ref 33–65)
CD4 T Cell Abs: 768 /uL (ref 400–1790)

## 2023-09-14 LAB — RPR TITER: RPR Titer: 1:1 {titer} — ABNORMAL HIGH

## 2023-09-26 ENCOUNTER — Other Ambulatory Visit: Payer: Self-pay

## 2023-09-26 ENCOUNTER — Encounter: Payer: Self-pay | Admitting: Family

## 2023-09-26 ENCOUNTER — Ambulatory Visit (INDEPENDENT_AMBULATORY_CARE_PROVIDER_SITE_OTHER): Payer: Commercial Managed Care - HMO | Admitting: Family

## 2023-09-26 VITALS — BP 112/75 | HR 80 | Temp 97.2°F | Ht 69.0 in | Wt 192.0 lb

## 2023-09-26 DIAGNOSIS — Z23 Encounter for immunization: Secondary | ICD-10-CM

## 2023-09-26 DIAGNOSIS — A630 Anogenital (venereal) warts: Secondary | ICD-10-CM

## 2023-09-26 DIAGNOSIS — Z79899 Other long term (current) drug therapy: Secondary | ICD-10-CM

## 2023-09-26 DIAGNOSIS — B079 Viral wart, unspecified: Secondary | ICD-10-CM | POA: Diagnosis not present

## 2023-09-26 DIAGNOSIS — F17200 Nicotine dependence, unspecified, uncomplicated: Secondary | ICD-10-CM

## 2023-09-26 DIAGNOSIS — A539 Syphilis, unspecified: Secondary | ICD-10-CM | POA: Insufficient documentation

## 2023-09-26 DIAGNOSIS — F17210 Nicotine dependence, cigarettes, uncomplicated: Secondary | ICD-10-CM

## 2023-09-26 DIAGNOSIS — Z0000 Encounter for general adult medical examination without abnormal findings: Secondary | ICD-10-CM

## 2023-09-26 DIAGNOSIS — B20 Human immunodeficiency virus [HIV] disease: Secondary | ICD-10-CM

## 2023-09-26 MED ORDER — BIKTARVY 50-200-25 MG PO TABS: 1.0000 | ORAL_TABLET | Freq: Every day | ORAL | 6 refills | Status: DC

## 2023-09-26 NOTE — Assessment & Plan Note (Signed)
Rickey Jones continues to have well-controlled virus with good adherence and tolerance to [employer].  Reviewed lab work and discussed plan of care and U equals U.  Covered by [employer] with no problems obtaining medication.  Continue current dose of Biktarvy.  Plan for follow-up in 6 months or sooner if needed with lab work 1 to 2 weeks prior to appointment.

## 2023-09-26 NOTE — Patient Instructions (Signed)
Nice to see you.  Continue to take your medication daily as prescribed.  Refills have been sent to the pharmacy.  Plan for follow up in 6 months or sooner if needed with lab work 1-2 weeks prior to appointment.   Have a great day and stay safe!  

## 2023-09-26 NOTE — Assessment & Plan Note (Signed)
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 Discussed nature of RPR testing and monitoring.

## 2023-09-26 NOTE — Assessment & Plan Note (Signed)
Discussed importance of safe sexual practice and condom use. Condoms and STD testing offered.  Vaccinations reviewed - Covid vaccine updated Colonoscopy in process Due for anal cancer screening - referral to General Surgery for HRA with wart evaluation

## 2023-09-26 NOTE — Assessment & Plan Note (Signed)
Mr. Baise continues to smoke tobacco. Discussed importance of tobacco cessation to reduce risk of complications or future disease. Not ready to quit at this time.

## 2023-09-26 NOTE — Progress Notes (Signed)
Brief Narrative   Patient ID: Rickey Jones, male    DOB: 02-06-1976, 47 y.o.   MRN: [id number]  Rickey Jones is a 47 y/o gentleman initially diagnosed with HIV around 2003 with risk factor of MSM. Initial viral load and CD4 count are unknown. History of thrush. Genotype from 04/03/19 with no medication resistance mutations. [id number] negative. Previous ART experience with [facility], [facility], and now [employer].    Subjective:    Chief Complaint  Patient presents with   Follow-up    Pain in bilateral heels, thinks it may be due to shoes Multiple warts on fingers    HPI:  Rickey Jones is a 47 y.o. male with HIV disease last seen on 04/04/2023 with well-controlled virus and good adherence and tolerance to [employer].  Viral load was undetectable with CD4 count 595.  Kidney function, liver function, electrolytes within normal ranges.  Most recent lab work completed on 09/12/2023 with viral load that remains undetectable and CD4 count 768.  Kidney function, liver function, electrolytes remain within normal ranges.  RPR serofast at 1: 1 with urine negative for gonorrhea and chlamydia.  Here today for routine follow-up.  Rickey Jones has been doing okay since his last office visit and continues to take [facility] as prescribed with no adverse side effects or problems obtaining medication from the pharmacy.  Remains covered by [place].  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  Has concerns about warts located on his right hand as well as his rectum that have been going on for several years.  The warts on his hands have been going on for approximately 2 years and have been refractory to over-the-counter treatments.  The rectal warts have come and gone over time describes "internal and external" warts.  In the process of screening for colonoscopy. Condoms and STD testing offered. Healthcare maintenance reviewed.   Denies fevers, chills, night sweats, headaches, changes in  vision, neck pain/stiffness, nausea, diarrhea, vomiting, lesions or rashes.  Lab Results  Component Value Date   CD4TCELL 27 (L) 09/12/2023   CD4TABS 768 09/12/2023   Lab Results  Component Value Date   HIV1RNAQUANT Not Detected 09/12/2023     Allergies  Allergen Reactions   Keflex [Cephalexin] Hives      Outpatient Medications Prior to Visit  Medication Sig Dispense Refill   bictegravir-emtricitabine-tenofovir AF (BIKTARVY) 50-200-25 MG TABS tablet Take 1 tablet by mouth daily. 30 tablet 6   No facility-administered medications prior to visit.     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  Respiratory:  Negative for cough, chest tightness, shortness of breath and wheezing.   Cardiovascular:  Negative for chest pain.  Gastrointestinal:  Negative for abdominal pain, diarrhea, nausea and vomiting.  Skin:        Multiple warts       Objective:    BP 112/75   Pulse 80   Temp (!) 97.2 F (36.2 C) (Temporal)   Ht 5\' 9"  (1.753 m)   Wt 192 lb (87.1 kg)   SpO2 97%   BMI 28.35 kg/m  Nursing note and vital signs reviewed.  Physical Exam Constitutional:      General: He is not in acute distress.    Appearance: He is well-developed.  Eyes:     Conjunctiva/sclera: Conjunctivae normal.  Cardiovascular:     Rate and Rhythm: Normal rate and regular rhythm.     Heart sounds: Normal heart sounds. No murmur heard.    No friction rub. No gallop.  Pulmonary:     Effort: Pulmonary effort is normal. No respiratory distress.     Breath sounds: Normal breath sounds. No wheezing or rales.  Chest:     Chest wall: No tenderness.  Abdominal:     General: Bowel sounds are normal.      Palpations: Abdomen is soft.     Tenderness: There is no abdominal tenderness.  Musculoskeletal:     Cervical back: Neck supple.  Lymphadenopathy:     Cervical: No cervical adenopathy.  Skin:    General: Skin is warm and dry.     Findings: No rash.     Comments: Verruca vulgaris noted on right hand (picture).   Neurological:     Mental Status: He is alert.         04/04/2023   10:33 AM 03/08/2022   10:09 AM 09/21/2021    9:34 AM 03/17/2021    9:43 AM 06/13/2020   10:07 AM  Depression screen PHQ 2/9  Decreased Interest 0 0 3 0 0  Down, Depressed, Hopeless 0 1 3 0 0  PHQ - 2 Score 0 1 6 0 0  Altered sleeping   3    Tired, decreased energy   3    Change in appetite   3    Feeling bad or failure about yourself    3    Trouble concentrating   3    Moving slowly or fidgety/restless   0    Suicidal thoughts   0    PHQ-9 Score   21         Assessment & Plan:    Patient Active Problem List   Diagnosis Date Noted   Syphilis 09/26/2023   Screening for STDs (sexually transmitted diseases) 08/02/2019   Facial rash 05/03/2019   Healthcare maintenance 05/03/2019   Tongue coating 05/03/2019   AIDS (acquired immune deficiency syndrome) (HCC) 04/03/2019   RASH AND OTHER NONSPECIFIC SKIN ERUPTION 09/26/2007   PENILE LESION 08/28/2007   COUGH 08/28/2007   TOBACCO USER 01/24/2007   HUMAN PAPILLOMAVIRUS 12/26/2006   HEPATITIS B, HX OF 12/26/2006   HIV DISEASE 11/25/2004     Problem List Items Addressed This Visit       Other   TOBACCO USER    Rickey Jones continues to smoke tobacco. Discussed importance of tobacco cessation to reduce risk of complications or future disease. Not ready to quit at this time.      AIDS (acquired immune deficiency syndrome) [facility])    Rickey Jones continues to have well-controlled virus with good adherence and tolerance to [employer].  Reviewed lab work and discussed plan of care and U equals U.  Covered by [employer] with no problems obtaining medication.   Continue current dose of Biktarvy.  Plan for follow-up in 6 months or sooner if needed with lab work 1 to 2 weeks prior to appointment.      Relevant Medications   bictegravir-emtricitabine-tenofovir AF (BIKTARVY) 50-200-25 MG TABS tablet   Other Relevant Orders   CBC   COMPLETE METABOLIC PANEL WITH GFR   HIV-1 RNA quant-no reflex-bld   T-helper  cell (CD4)- ([facility] clinic only)   Healthcare maintenance    Discussed importance of safe sexual practice and condom use. Condoms and STD testing offered.  Vaccinations reviewed - Covid vaccine updated Colonoscopy in process Due for anal cancer screening - referral to General Surgery for HRA with wart evaluation      Relevant Orders   Pfizer Comirnaty Covid-19 Vaccine 14yrs & older (Completed)   Syphilis    Rickey Jones RPR appears to be serofast at 1:1 with no current evidence of infection or indication for treatment. Discussed nature of RPR testing and monitoring.       Relevant Medications   bictegravir-emtricitabine-tenofovir AF (BIKTARVY) 50-200-25 MG TABS tablet   Other Relevant Orders   RPR   Other Visit Diagnoses     Anal condyloma    -  Primary   Relevant Medications   bictegravir-emtricitabine-tenofovir AF (BIKTARVY) 50-200-25 MG TABS tablet   Other Relevant Orders   Ambulatory referral to General Surgery   Viral wart on finger       Relevant Medications   bictegravir-emtricitabine-tenofovir AF (BIKTARVY) 50-200-25 MG TABS tablet   Other Relevant Orders   Ambulatory referral to Dermatology   Pharmacologic therapy       Relevant Orders   Lipid panel        I am having Rickey Jones maintain his [place].   Meds ordered this encounter  Medications   bictegravir-emtricitabine-tenofovir AF (BIKTARVY) 50-200-25 MG TABS tablet    Sig: Take 1 tablet by mouth daily.    Dispense:  30 tablet    Refill:  6    Order Specific Question:   Supervising Provider    Answer:   Judyann Munson [[id number]]     Follow-up: Return in  about 6 months (around 03/26/2024), or if symptoms worsen or fail to improve. or sooner if needed.    Marcos Eke, MSN, FNP-C Nurse Practitioner [facility] for Infectious Disease [facility] Main number: [phone]

## 2023-11-29 ENCOUNTER — Ambulatory Visit: Payer: Self-pay | Admitting: General Surgery

## 2023-11-29 NOTE — H&P (Signed)
 REFERRING PHYSICIAN:  Beth Miquel HERO, DO   PROVIDER:  BERNARDA WANDA NED, MD   MRN: [id number] DOB: 25-Jul-1976 DATE OF ENCOUNTER: 11/29/2023   Subjective    Chief Complaint: New Consultation ( Anal condyloma)       History of Present Illness: Rickey Jones is a 48 y.o. male who is seen today as an office consultation at the request of Gregory Calone for evaluation of New Consultation ( Anal condyloma) .  47 year old HIV-positive male with 2 small anal rectal lesions noted on rectal exam.  He is here for further evaluation.  He believes these have been present for quite a while.     Review of Systems: A complete review of systems was obtained from the patient.  I have reviewed this information and discussed as appropriate with the patient.  See HPI as well for other ROS.     Medical History: Past Medical History      Past Medical History:  Diagnosis Date   HIV -AIDS with opportunistic infection, Symptomatic (CMS/HHS-HCC)           Problem List  There is no problem list on file for this patient.      Past Surgical History       Past Surgical History:  Procedure Laterality Date   HERNIA REPAIR            Allergies      Allergies  Allergen Reactions   Cephalosporins Hives        Medications Ordered Prior to Encounter        Current Outpatient Medications on File Prior to Visit  Medication Sig Dispense Refill   BIKTARVY  50-200-25 mg tablet Take 1 tablet by mouth once daily        No current facility-administered medications on file prior to visit.        Family History  History reviewed. No pertinent family history.      Tobacco Use History  Social History        Tobacco Use  Smoking Status Every Day   Types: Cigarettes  Smokeless Tobacco Never        Social History  Social History         Socioeconomic History   Marital status: Single  Tobacco Use   Smoking status: Every Day      Types: Cigarettes   Smokeless tobacco: Never   Vaping Use   Vaping status: Unknown  Substance and Sexual Activity   Alcohol use: Yes   Drug use: Never    Social Drivers of Health        Housing Stability: Unknown (11/29/2023)    Housing Stability Vital Sign     Homeless in the Last Year: No        Objective:         Vitals:    11/29/23 1034  BP: 124/77  Pulse: 80  Temp: 36.7 C (98.1 F)  SpO2: 97%  Weight: 90.3 kg (199 lb)  Height: 175.3 cm (5' 9)  PainSc: 0-No pain      Exam Gen: NAD   Rectal: 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  Small posterior perianal lesion     Labs, Imaging and Diagnostic Testing: HIV ND, CD4 768   Assessment and Plan:  Diagnoses and all orders for this visit:   Anal condyloma   48 year old male with HIV who presents to the office for evaluation of anal condyloma.  He does have at least 1 cluster  of condyloma internally.  I have recommended laser ablation with high-resolution anoscopy and possible biopsy and ablation of any abnormal areas.  We have discussed this in detail.  All questions were answered.  Risk include bleeding pain and recurrence.   We discussed the management of anal warts. We discussed chemical destruction, immunotherapy, and surgical excision. I discussed the pros and cons of each approach. We discussed the risk and benefits and the expected outcome with chemical destruction with topical agents. I explained that topical agents are generally not effective and has a high recurrence rate. We discussed the use of Aldara ointment. I explained that it has a 30-70% chance at resolving or at least reducing the number of external anal warts. I explained that it is applied 3 times a week at night and left on overnight. I explained that skin irritation is the most common side effect. We then discussed surgical excision and fulguration. I explained how the surgery is performed. I explained that it can be painful however it generally has the highest success  rate. We discussed the risk and benefits of surgery including but not limited to bleeding, infection, injury to surrounding structures, need to do a formal anoscopic exam to evaluate for anal canal warts, urinary retention, wart recurrence, and general anesthesia risk. We discussed the typical aftercare.      Bernarda JAYSON Ned, MD Colon and Rectal Surgery [facility]

## 2024-03-13 NOTE — Progress Notes (Signed)
 The 10-year ASCVD risk score (Arnett DK, et al., 2019) is: 5.2%   Values used to calculate the score:     Age: 48 years     Sex: Male     Is Non-Hispanic African American: No     Diabetic: No     Tobacco smoker: Yes     Systolic Blood Pressure: 124 mmHg     Is BP treated: No     HDL Cholesterol: 34 mg/dL     Total Cholesterol: 151 mg/dL  No current statin therapy, next appointment note updated.   Keiton Cosma, BSN, RN

## 2024-03-21 ENCOUNTER — Ambulatory Visit: Payer: Self-pay | Admitting: General Surgery

## 2024-03-21 NOTE — Progress Notes (Signed)
 Surgery orders requested via Epic inbox.

## 2024-03-22 NOTE — Progress Notes (Addendum)
 Anesthesia Review:  PCP: none  Cardiologist : none  Inf Dis- Gregory Calone,NP LOV 09/26/23   PPM/ ICD: Device Orders: Rep Notified:  Chest x-ray : EKG : Echo : Stress test: Cardiac Cath :   Activity level: can do a flight of stairs wtihout difficutly  Sleep Study/ CPAP : none  Fasting Blood Sugar :      / Checks Blood Sugar -- times a day:    Blood Thinner/ Instructions /Last Dose: ASA / Instructions/ Last Dose :    03/26/24- CBC and CMP- in epic    On preop on 03/28/24 pt reports he has no transportation home for surgery nor anyone to be availble to him for 24 hours post procedure.  Instructed pt to call his insurance to see if they offer transportatoin and to call them today.  PT has preop nurse phone number to let preop nurse be aware of info and to call me as soon as possible.  Preop nurse also called [facility] and spoke with Peterson Brandt in Triage and made her aware.    Pt called back on 03/30/24 and LVMM and stated he now had transportation for DOS.   PT called back on 04/02/24 and LVMM stating driver would be Bev Covington- number- [phone].

## 2024-03-22 NOTE — Patient Instructions (Signed)
 SURGICAL WAITING ROOM VISITATION  Patients having surgery or a procedure may have no more than 2 support people in the waiting area - these visitors may rotate.    Children under the age of 53 must have an adult with them who is not the patient.   Visitors with respiratory illnesses are discouraged from visiting and should remain at home.  If the patient needs to stay at the hospital during part of their recovery, the visitor guidelines for inpatient rooms apply. Pre-op nurse will coordinate an appropriate time for 1 support person to accompany patient in pre-op.  This support person may not rotate.    Please refer to the [facility] website for the visitor guidelines for Inpatients (after your surgery is over and you are in a regular room).       Your procedure is scheduled on: 04/04/2024    Report to [facility] Main Entrance    Report to admitting at   1030AM   Call this number if you have problems the morning of surgery [phone]   Do not eat food :After Midnight.   After Midnight you may have the following liquids until ___ 0930___ AM DAY OF SURGERY  Water Non-Citrus Juices (without pulp, NO RED-Apple, White grape, White cranberry) Black Coffee (NO MILK/CREAM OR CREAMERS, sugar ok)  Clear Tea (NO MILK/CREAM OR CREAMERS, sugar ok) regular and decaf                             Plain Jell-O (NO RED)                                           Fruit ices (not with fruit pulp, NO RED)                                     Popsicles (NO RED)                                                               Sports drinks like Gatorade (NO RED)                           If you have questions, please contact your surgeon's office.       Oral Hygiene is also important to reduce your risk of infection.                                    Remember - BRUSH YOUR TEETH THE MORNING OF SURGERY WITH YOUR REGULAR TOOTHPASTE  DENTURES WILL BE REMOVED PRIOR TO SURGERY PLEASE DO NOT  APPLY "Poly grip" OR ADHESIVES!!!   Do NOT smoke after Midnight   Stop all vitamins and herbal supplements 7 days before surgery.   Take these medicines the morning of surgery with A SIP OF WATER:  Biktarvy    DO NOT TAKE ANY ORAL DIABETIC MEDICATIONS DAY OF YOUR SURGERY  Bring CPAP mask and tubing day of  surgery.                              You may not have any metal on your body including hair pins, jewelry, and body piercing             Do not wear make-up, lotions, powders, perfumes/cologne, or deodorant  Do not wear nail polish including gel and S&S, artificial/acrylic nails, or any other type of covering on natural nails including finger and toenails. If you have artificial nails, gel coating, etc. that needs to be removed by a nail salon please have this removed prior to surgery or surgery may need to be canceled/ delayed if the surgeon/ anesthesia feels like they are unable to be safely monitored.   Do not shave  48 hours prior to surgery.               Men may shave face and neck.   Do not bring valuables to the hospital. [facility] IS NOT             RESPONSIBLE   FOR VALUABLES.   Contacts, glasses, dentures or bridgework may not be worn into surgery.   Bring small overnight bag day of surgery.   DO NOT BRING YOUR HOME MEDICATIONS TO THE HOSPITAL. PHARMACY WILL DISPENSE MEDICATIONS LISTED ON YOUR MEDICATION LIST TO YOU DURING YOUR ADMISSION IN THE HOSPITAL!    Patients discharged on the day of surgery will not be allowed to drive home.  Someone NEEDS to stay with you for the first 24 hours after anesthesia.   Special Instructions: Bring a copy of your healthcare power of attorney and living will documents the day of surgery if you haven't scanned them before.              Please read over the following fact sheets you were given: IF YOU HAVE QUESTIONS ABOUT YOUR PRE-OP INSTRUCTIONS PLEASE CALL [phone]   If you received a COVID test during your pre-op visit  it  is requested that you wear a mask when out in public, stay away from anyone that may not be feeling well and notify your surgeon if you develop symptoms. If you test positive for Covid or have been in contact with anyone that has tested positive in the last 10 days please notify you surgeon.    [facility] - Preparing for Surgery Before surgery, you can play an important role.  Because skin is not sterile, your skin needs to be as free of germs as possible.  You can reduce the number of germs on your skin by washing with CHG (chlorahexidine gluconate) soap before surgery.  CHG is an antiseptic cleaner which kills germs and bonds with the skin to continue killing germs even after washing. Please DO NOT use if you have an allergy to CHG or antibacterial soaps.  If your skin becomes reddened/irritated stop using the CHG and inform your nurse when you arrive at Short Stay. Do not shave (including legs and underarms) for at least 48 hours prior to the first CHG shower.  You may shave your face/neck. Please follow these instructions carefully:  1.  Shower with CHG Soap the night before surgery and the  morning of Surgery.  2.  If you choose to wash your hair, wash your hair first as usual with your  normal  shampoo.  3.  After you shampoo, rinse your hair and body thoroughly to  remove the  shampoo.                           4.  Use CHG as you would any other liquid soap.  You can apply chg directly  to the skin and wash                       Gently with a scrungie or clean washcloth.  5.  Apply the CHG Soap to your body ONLY FROM THE NECK DOWN.   Do not use on face/ open                           Wound or open sores. Avoid contact with eyes, ears mouth and genitals (private parts).                       Wash face,  Genitals (private parts) with your normal soap.             6.  Wash thoroughly, paying special attention to the area where your surgery  will be performed.  7.  Thoroughly rinse your body with  warm water from the neck down.  8.  DO NOT shower/wash with your normal soap after using and rinsing off  the CHG Soap.                9.  Pat yourself dry with a clean towel.            10.  Wear clean pajamas.            11.  Place clean sheets on your bed the night of your first shower and do not  sleep with pets. Day of Surgery : Do not apply any lotions/deodorants the morning of surgery.  Please wear clean clothes to the hospital/surgery center.  FAILURE TO FOLLOW THESE INSTRUCTIONS MAY RESULT IN THE CANCELLATION OF YOUR SURGERY PATIENT SIGNATURE_________________________________  NURSE SIGNATURE__________________________________  ________________________________________________________________________

## 2024-03-26 ENCOUNTER — Other Ambulatory Visit: Payer: Self-pay

## 2024-03-26 ENCOUNTER — Other Ambulatory Visit: Payer: Commercial Managed Care - HMO

## 2024-03-26 DIAGNOSIS — B20 Human immunodeficiency virus [HIV] disease: Secondary | ICD-10-CM

## 2024-03-26 DIAGNOSIS — A539 Syphilis, unspecified: Secondary | ICD-10-CM

## 2024-03-26 DIAGNOSIS — Z79899 Other long term (current) drug therapy: Secondary | ICD-10-CM

## 2024-03-27 LAB — T-HELPER CELL (CD4) - (RCID CLINIC ONLY)
CD4 % Helper T Cell: 30 % — ABNORMAL LOW (ref 33–65)
CD4 T Cell Abs: 845 /uL (ref 400–1790)

## 2024-03-28 ENCOUNTER — Encounter (HOSPITAL_COMMUNITY): Payer: Self-pay

## 2024-03-28 ENCOUNTER — Other Ambulatory Visit: Payer: Self-pay

## 2024-03-28 ENCOUNTER — Encounter (HOSPITAL_COMMUNITY)
Admission: RE | Admit: 2024-03-28 | Discharge: 2024-03-28 | Disposition: A | Discharge status: Home / Self Care | Source: Ambulatory Visit | Attending: General Surgery | Admitting: General Surgery

## 2024-03-28 VITALS — BP 142/89 | HR 73 | Temp 99.2°F | Resp 16 | Ht 70.0 in | Wt 183.0 lb

## 2024-03-28 DIAGNOSIS — Z01812 Encounter for preprocedural laboratory examination: Secondary | ICD-10-CM | POA: Insufficient documentation

## 2024-03-28 DIAGNOSIS — Z01818 Encounter for other preprocedural examination: Secondary | ICD-10-CM

## 2024-03-28 HISTORY — DX: Pneumonia, unspecified organism: J18.9

## 2024-03-28 LAB — CBC
HCT: 44.7 % (ref 38.5–50.0)
Hemoglobin: 15.2 g/dL (ref 13.2–17.1)
MCH: 31.9 pg (ref 27.0–33.0)
MCHC: 34 g/dL (ref 32.0–36.0)
MCV: 93.9 fL (ref 80.0–100.0)
MPV: 11.3 fL (ref 7.5–12.5)
Platelets: 166 10*3/uL (ref 140–400)
RBC: 4.76 10*6/uL (ref 4.20–5.80)
RDW: 12.8 % (ref 11.0–15.0)
WBC: 5.6 10*3/uL (ref 3.8–10.8)

## 2024-03-28 LAB — LIPID PANEL
Cholesterol: 177 mg/dL (ref <200)
HDL: 39 mg/dL — ABNORMAL LOW (ref >=40)
LDL Cholesterol (Calc): 109 mg/dL — ABNORMAL HIGH
Non-HDL Cholesterol (Calc): 138 mg/dL — ABNORMAL HIGH (ref <130)
Total CHOL/HDL Ratio: 4.5 (calc) (ref <5.0)
Triglycerides: 170 mg/dL — ABNORMAL HIGH (ref <150)

## 2024-03-28 LAB — COMPLETE METABOLIC PANEL WITHOUT GFR
AG Ratio: 2 (calc) (ref 1.0–2.5)
ALT: 30 U/L (ref 9–46)
AST: 17 U/L (ref 10–40)
Albumin: 4.4 g/dL (ref 3.6–5.1)
Alkaline phosphatase (APISO): 75 U/L (ref 36–130)
BUN: 21 mg/dL (ref 7–25)
CO2: 28 mmol/L (ref 20–32)
Calcium: 9.5 mg/dL (ref 8.6–10.3)
Chloride: 105 mmol/L (ref 98–110)
Creat: 1.06 mg/dL (ref 0.60–1.29)
Globulin: 2.2 g/dL (ref 1.9–3.7)
Glucose, Bld: 113 mg/dL — ABNORMAL HIGH (ref 65–99)
Potassium: 4.3 mmol/L (ref 3.5–5.3)
Sodium: 141 mmol/L (ref 135–146)
Total Bilirubin: 0.3 mg/dL (ref 0.2–1.2)
Total Protein: 6.6 g/dL (ref 6.1–8.1)

## 2024-03-28 LAB — HIV-1 RNA QUANT-NO REFLEX-BLD
HIV 1 RNA Quant: <20 {copies}/mL — AB
HIV-1 RNA Quant, Log: <1.3 {Log_copies}/mL — AB

## 2024-03-28 LAB — RPR: RPR Ser Ql: NONREACTIVE

## 2024-03-28 NOTE — Anesthesia Preprocedure Evaluation (Addendum)
 Anesthesia Evaluation  Patient identified by MRN, date of birth, ID band Patient awake    Reviewed: Allergy & Precautions, NPO status , Patient's Chart, lab work & pertinent test results  Airway Mallampati: II  TM Distance: >3 FB Neck ROM: Full    Dental no notable dental hx.    Pulmonary Current Smoker   Pulmonary exam normal        Cardiovascular negative cardio ROS  Rhythm:Regular Rate:Normal     Neuro/Psych negative neurological ROS  negative psych ROS   GI/Hepatic Neg liver ROS,,,Anal condyloma    Endo/Other  negative endocrine ROS    Renal/GU negative Renal ROS  negative genitourinary   Musculoskeletal   Abdominal Normal abdominal exam  (+)   Peds  Hematology  (+) HIVLab Results      Component                Value               Date                      WBC                      5.6                 03/26/2024                HGB                      15.2                03/26/2024                HCT                      44.7                03/26/2024                MCV                      93.9                03/26/2024                PLT                      166                 03/26/2024             Lab Results      Component                Value               Date                      NA                       141                 03/26/2024                K  4.3                 03/26/2024                CO2                      28                  03/26/2024                GLUCOSE                  113 (H)             03/26/2024                BUN                      21                  03/26/2024                CREATININE               1.06                03/26/2024                CALCIUM                  9.5                 03/26/2024                EGFR                     91                  09/12/2023                GFRNONAA                 97                  06/04/2019               Anesthesia Other Findings   Reproductive/Obstetrics                              Anesthesia Physical Anesthesia Plan  ASA: 3  Anesthesia Plan: MAC   Post-op Pain Management:    Induction: Intravenous  PONV Risk Score and Plan: Ondansetron , Dexamethasone, Propofol infusion, Treatment may vary due to age or medical condition and Midazolam  Airway Management Planned: Simple Face Mask and Nasal Cannula  Additional Equipment: None  Intra-op Plan:   Post-operative Plan:   Informed Consent: I have reviewed the patients History and Physical, chart, labs and discussed the procedure including the risks, benefits and alternatives for the proposed anesthesia with the patient or authorized representative who has indicated his/her understanding and acceptance.     Dental advisory given  Plan Discussed with: CRNA  Anesthesia Plan Comments: (48yo male with hx of HIV on Biktarvy . Followed by ID. CD4 counts normal and viral load undetectable on last check on 6/2)         Anesthesia Quick Evaluation

## 2024-04-04 ENCOUNTER — Encounter (HOSPITAL_COMMUNITY)
Admission: RE | Disposition: A | Discharge status: Home / Self Care | Payer: Self-pay | Source: Home / Self Care | Attending: General Surgery

## 2024-04-04 ENCOUNTER — Other Ambulatory Visit: Payer: Self-pay

## 2024-04-04 ENCOUNTER — Ambulatory Visit (HOSPITAL_COMMUNITY): Payer: Self-pay | Admitting: Anesthesiology

## 2024-04-04 ENCOUNTER — Encounter (HOSPITAL_COMMUNITY): Payer: Self-pay | Admitting: General Surgery

## 2024-04-04 ENCOUNTER — Ambulatory Visit (HOSPITAL_COMMUNITY): Payer: Self-pay | Admitting: Medical

## 2024-04-04 ENCOUNTER — Ambulatory Visit (HOSPITAL_COMMUNITY)
Admission: RE | Admit: 2024-04-04 | Discharge: 2024-04-04 | Disposition: A | Discharge status: Home / Self Care | Attending: General Surgery | Admitting: General Surgery

## 2024-04-04 DIAGNOSIS — K6282 Dysplasia of anus: Secondary | ICD-10-CM | POA: Insufficient documentation

## 2024-04-04 DIAGNOSIS — Z01818 Encounter for other preprocedural examination: Secondary | ICD-10-CM

## 2024-04-04 DIAGNOSIS — B20 Human immunodeficiency virus [HIV] disease: Secondary | ICD-10-CM | POA: Insufficient documentation

## 2024-04-04 DIAGNOSIS — A630 Anogenital (venereal) warts: Secondary | ICD-10-CM

## 2024-04-04 DIAGNOSIS — F17210 Nicotine dependence, cigarettes, uncomplicated: Secondary | ICD-10-CM | POA: Insufficient documentation

## 2024-04-04 DIAGNOSIS — B1910 Unspecified viral hepatitis B without hepatic coma: Secondary | ICD-10-CM

## 2024-04-04 HISTORY — PX: LASER ABLATION CONDOLAMATA: SHX5941

## 2024-04-04 HISTORY — PX: HIGH RESOLUTION ANOSCOPY: SHX6345

## 2024-04-04 SURGERY — ANOSCOPY, HIGH RESOLUTION
Anesthesia: Monitor Anesthesia Care | Site: Rectum

## 2024-04-04 MED ORDER — MIDAZOLAM HCL 2 MG/2ML IJ SOLN
INTRAMUSCULAR | Status: AC
Start: 2024-04-04 — End: 2024-04-04
  Filled 2024-04-04: qty 2

## 2024-04-04 MED ORDER — FENTANYL CITRATE PF 50 MCG/ML IJ SOSY: 25.0000 ug | PREFILLED_SYRINGE | INTRAMUSCULAR | Status: DC | PRN

## 2024-04-04 MED ORDER — FENTANYL CITRATE (PF) 100 MCG/2ML IJ SOLN
INTRAMUSCULAR | Status: AC
Start: 2024-04-04 — End: 2024-04-04
  Filled 2024-04-04: qty 2

## 2024-04-04 MED ORDER — ACETIC ACID 5 % SOLN
Status: DC | PRN
  Administered 2024-04-04: 1 via TOPICAL

## 2024-04-04 MED ORDER — BUPIVACAINE-EPINEPHRINE (PF) 0.5% -1:200000 IJ SOLN
INTRAMUSCULAR | Status: AC
  Filled 2024-04-04: qty 30

## 2024-04-04 MED ORDER — BUPIVACAINE-EPINEPHRINE 0.5% -1:200000 IJ SOLN
INTRAMUSCULAR | Status: DC | PRN
Start: 2024-04-04 — End: 2024-04-04
  Administered 2024-04-04: 30 mL

## 2024-04-04 MED ORDER — MIDAZOLAM HCL 2 MG/2ML IJ SOLN
INTRAMUSCULAR | Status: DC | PRN
  Administered 2024-04-04: 2 mg via INTRAVENOUS

## 2024-04-04 MED ORDER — KETAMINE HCL 50 MG/5ML IJ SOSY
PREFILLED_SYRINGE | INTRAMUSCULAR | Status: AC
  Filled 2024-04-04: qty 5

## 2024-04-04 MED ORDER — DROPERIDOL 2.5 MG/ML IJ SOLN: 0.6250 mg | Freq: Once | INTRAMUSCULAR | Status: DC | PRN

## 2024-04-04 MED ORDER — ACETIC ACID 5 % SOLN
Status: AC
  Filled 2024-04-04: qty 12

## 2024-04-04 MED ORDER — DEXAMETHASONE SODIUM PHOSPHATE 10 MG/ML IJ SOLN
INTRAMUSCULAR | Status: DC | PRN
  Administered 2024-04-04: 8 mg via INTRAVENOUS

## 2024-04-04 MED ORDER — LACTATED RINGERS IV SOLN
INTRAVENOUS | Status: DC | PRN
Start: 2024-04-04 — End: 2024-04-04

## 2024-04-04 MED ORDER — LIDOCAINE HCL (PF) 2 % IJ SOLN
INTRAMUSCULAR | Status: AC
  Filled 2024-04-04: qty 5

## 2024-04-04 MED ORDER — ORAL CARE MOUTH RINSE: 15.0000 mL | Freq: Once | OROMUCOSAL | Status: AC

## 2024-04-04 MED ORDER — FENTANYL CITRATE (PF) 100 MCG/2ML IJ SOLN
INTRAMUSCULAR | Status: DC | PRN
  Administered 2024-04-04: 100 ug via INTRAVENOUS

## 2024-04-04 MED ORDER — TRAMADOL HCL 50 MG PO TABS: 50.0000 mg | ORAL_TABLET | Freq: Four times a day (QID) | ORAL | 0 refills | Status: DC | PRN

## 2024-04-04 MED ORDER — ONDANSETRON HCL 4 MG/2ML IJ SOLN
INTRAMUSCULAR | Status: AC
  Filled 2024-04-04: qty 2

## 2024-04-04 MED ORDER — ACETAMINOPHEN 500 MG PO TABS
1000.0000 mg | ORAL_TABLET | ORAL | Status: AC
  Administered 2024-04-04: 1000 mg via ORAL
  Filled 2024-04-04: qty 2

## 2024-04-04 MED ORDER — PROPOFOL 10 MG/ML IV BOLUS
INTRAVENOUS | Status: DC | PRN
  Administered 2024-04-04: 100 mg via INTRAVENOUS
  Administered 2024-04-04: 90 ug/kg/min via INTRAVENOUS

## 2024-04-04 MED ORDER — PROPOFOL 1000 MG/100ML IV EMUL
INTRAVENOUS | Status: AC
  Filled 2024-04-04: qty 100

## 2024-04-04 MED ORDER — ACETAMINOPHEN 500 MG PO TABS: 1000.0000 mg | ORAL_TABLET | Freq: Once | ORAL | Status: DC

## 2024-04-04 MED ORDER — CHLORHEXIDINE GLUCONATE 0.12 % MT SOLN
15.0000 mL | Freq: Once | OROMUCOSAL | Status: AC
  Administered 2024-04-04: 15 mL via OROMUCOSAL

## 2024-04-04 MED ORDER — ONDANSETRON HCL 4 MG/2ML IJ SOLN
INTRAMUSCULAR | Status: DC | PRN
  Administered 2024-04-04: 4 mg via INTRAVENOUS

## 2024-04-04 MED ORDER — SODIUM CHLORIDE 0.9% FLUSH: 3.0000 mL | Freq: Two times a day (BID) | INTRAVENOUS | Status: DC

## 2024-04-04 MED ORDER — DEXAMETHASONE SODIUM PHOSPHATE 10 MG/ML IJ SOLN
INTRAMUSCULAR | Status: AC
  Filled 2024-04-04: qty 1

## 2024-04-04 MED ORDER — PHENYLEPHRINE 80 MCG/ML (10ML) SYRINGE FOR IV PUSH (FOR BLOOD PRESSURE SUPPORT)
PREFILLED_SYRINGE | INTRAVENOUS | Status: AC
  Filled 2024-04-04: qty 10

## 2024-04-04 MED ORDER — LACTATED RINGERS IV SOLN: INTRAVENOUS | Status: DC

## 2024-04-04 SURGICAL SUPPLY — 53 items
APPLICATOR COTTON TIP 6 STRL (MISCELLANEOUS) IMPLANT
APPLICATOR COTTON TIP 6IN STRL (MISCELLANEOUS) IMPLANT
BAG COUNTER SPONGE SURGICOUNT (BAG) IMPLANT
BENZOIN TINCTURE PRP APPL 2/3 (GAUZE/BANDAGES/DRESSINGS) ×1 IMPLANT
BLADE EXTENDED COATED 6.5IN (ELECTRODE) IMPLANT
BLADE HEX COATED 2.75 (ELECTRODE) ×1 IMPLANT
BLADE SURG 15 STRL LF DISP TIS (BLADE) ×1 IMPLANT
BLADE SURG SZ10 CARB STEEL (BLADE) ×1 IMPLANT
BRIEF MESH DISP LRG (UNDERPADS AND DIAPERS) ×1 IMPLANT
COVER BACK TABLE 60X90IN (DRAPES) ×1 IMPLANT
COVER MAYO STAND STRL (DRAPES) ×1 IMPLANT
COVER SURGICAL LIGHT HANDLE (MISCELLANEOUS) ×1 IMPLANT
DRAPE LAPAROTOMY T 98X78 PEDS (DRAPES) ×1 IMPLANT
DRAPE SHEET LG 3/4 BI-LAMINATE (DRAPES) IMPLANT
DRAPE UTILITY XL STRL (DRAPES) ×1 IMPLANT
DRSG VASELINE 3X18 (GAUZE/BANDAGES/DRESSINGS) IMPLANT
ELECT REM PT RETURN 15FT ADLT (MISCELLANEOUS) ×1 IMPLANT
ELECTRODE REM PT RTRN 9FT ADLT (ELECTROSURGICAL) ×1 IMPLANT
GAUZE 4X4 16PLY ~~LOC~~+RFID DBL (SPONGE) ×1 IMPLANT
GAUZE PAD ABD 8X10 STRL (GAUZE/BANDAGES/DRESSINGS) ×1 IMPLANT
GAUZE SPONGE 4X4 12PLY STRL (GAUZE/BANDAGES/DRESSINGS) IMPLANT
GLOVE BIO SURGEON STRL SZ 6.5 (GLOVE) ×1 IMPLANT
GLOVE INDICATOR 6.5 STRL GRN (GLOVE) ×1 IMPLANT
GOWN STRL REUS W/ TWL LRG LVL3 (GOWN DISPOSABLE) ×1 IMPLANT
GOWN STRL REUS W/ TWL XL LVL3 (GOWN DISPOSABLE) ×2 IMPLANT
KIT BASIN OR (CUSTOM PROCEDURE TRAY) ×1 IMPLANT
KIT SIGMOIDOSCOPE (SET/KITS/TRAYS/PACK) IMPLANT
KIT TURNOVER CYSTO (KITS) ×1 IMPLANT
KIT TURNOVER KIT A (KITS) IMPLANT
MANIFOLD NEPTUNE II (INSTRUMENTS) IMPLANT
NDL HYPO 22X1.5 SAFETY MO (MISCELLANEOUS) ×1 IMPLANT
NDL HYPO 25X1 1.5 SAFETY (NEEDLE) ×1 IMPLANT
NDL SAFETY ECLIPSE 18X1.5 (NEEDLE) IMPLANT
NEEDLE HYPO 22X1.5 SAFETY MO (MISCELLANEOUS) ×1 IMPLANT
NEEDLE HYPO 25X1 1.5 SAFETY (NEEDLE) ×1 IMPLANT
NS IRRIG 500ML POUR BTL (IV SOLUTION) ×1 IMPLANT
PAD ARMBOARD POSITIONER FOAM (MISCELLANEOUS) IMPLANT
PENCIL SMOKE EVACUATOR (MISCELLANEOUS) ×1 IMPLANT
PROTECTOR NERVE ULNAR (MISCELLANEOUS) IMPLANT
SPIKE FLUID TRANSFER (MISCELLANEOUS) ×1 IMPLANT
SPONGE SURGIFOAM ABS GEL 12-7 (HEMOSTASIS) IMPLANT
SUT CHROMIC 2 0 SH (SUTURE) IMPLANT
SUT CHROMIC 3 0 SH 27 (SUTURE) IMPLANT
SUT MON AB 3-0 SH27 (SUTURE) IMPLANT
SUT VIC AB 4-0 P-3 18XBRD (SUTURE) IMPLANT
SYR BULB IRRIG 60ML STRL (SYRINGE) ×1 IMPLANT
SYR CONTROL 10ML LL (SYRINGE) ×1 IMPLANT
TOWEL OR 17X24 6PK STRL BLUE (TOWEL DISPOSABLE) ×1 IMPLANT
TOWEL OR 17X26 10 PK STRL BLUE (TOWEL DISPOSABLE) ×2 IMPLANT
TUBING CONNECTING 10 (TUBING) ×1 IMPLANT
VACUUM HOSE 7/8X10 W/ WAND (MISCELLANEOUS) ×1 IMPLANT
WATER STERILE IRR 500ML POUR (IV SOLUTION) ×1 IMPLANT
YANKAUER SUCT BULB TIP NO VENT (SUCTIONS) ×1 IMPLANT

## 2024-04-04 NOTE — H&P (Signed)
 REFERRING PHYSICIAN:  Rhonda Certain, DO   PROVIDER:  Denese Finn, MD   MRN: [id number] DOB: 17-Sep-1976   Subjective    Chief Complaint: New Consultation ( Anal condyloma)       History of Present Illness: Rickey Jones is a 48 y.o. male who is seen today as an office consultation at the request of Gregory Calone for evaluation of New Consultation ( Anal condyloma) .  48 year old HIV-positive male with 2 small anal rectal lesions noted on rectal exam.  He is here for further evaluation.  He believes these have been present for quite a while.     Review of Systems: A complete review of systems was obtained from the patient.  I have reviewed this information and discussed as appropriate with the patient.  See HPI as well for other ROS.     Medical History: Past Medical History         Past Medical History:  Diagnosis Date   HIV -AIDS with opportunistic infection, Symptomatic (CMS/HHS-HCC)            Problem List  There is no problem list on file for this patient.      Past Surgical History           Past Surgical History:  Procedure Laterality Date   HERNIA REPAIR            Allergies         Allergies  Allergen Reactions   Cephalosporins Hives        Medications Ordered Prior to Encounter             Current Outpatient Medications on File Prior to Visit  Medication Sig Dispense Refill   BIKTARVY  50-200-25 mg tablet Take 1 tablet by mouth once daily        No current facility-administered medications on file prior to visit.        Family History  History reviewed. No pertinent family history.      Tobacco Use History  Social History           Tobacco Use  Smoking Status Every Day   Types: Cigarettes  Smokeless Tobacco Never        Social History  Social History             Socioeconomic History   Marital status: Single  Tobacco Use   Smoking status: Every Day      Types: Cigarettes   Smokeless tobacco: Never  Vaping  Use   Vaping status: Unknown  Substance and Sexual Activity   Alcohol use: Yes   Drug use: Never    Social Drivers of Health           Housing Stability: Unknown (11/29/2023)    Housing Stability Vital Sign     Homeless in the Last Year: No        Objective:      Vitals:   04/04/24 0934  BP: (!) 154/85  Pulse: 81  Resp: 16  Temp: 98.9 F (37.2 C)  SpO2: 96%    Exam Gen: NAD   Rectal: 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  Small posterior perianal lesion     Labs, Imaging and Diagnostic Testing: HIV ND, CD4 768   Assessment and Plan:  Diagnoses and all orders for this visit:   Anal condyloma   48 year old male with HIV who presents to the office for evaluation of anal condyloma.  He does have  at least 1 cluster of condyloma internally.  I have recommended laser ablation with high-resolution anoscopy and possible biopsy and ablation of any abnormal areas.  We have discussed this in detail.  All questions were answered.  Risk include bleeding pain and recurrence.   We discussed the management of anal warts. We discussed chemical destruction, immunotherapy, and surgical excision. I discussed the pros and cons of each approach. We discussed the risk and benefits and the expected outcome with chemical destruction with topical agents. I explained that topical agents are generally not effective and has a high recurrence rate. We discussed the use of Aldara ointment. I explained that it has a 30-70% chance at resolving or at least reducing the number of external anal warts. I explained that it is applied 3 times a week at night and left on overnight. I explained that skin irritation is the most common side effect. We then discussed surgical excision and fulguration. I explained how the surgery is performed. I explained that it can be painful however it generally has the highest success rate. We discussed the risk and benefits of surgery including but not  limited to bleeding, infection, injury to surrounding structures, need to do a formal anoscopic exam to evaluate for anal canal warts, urinary retention, wart recurrence, and general anesthesia risk. We discussed the typical aftercare.      Fernande Howells, MD Colon and Rectal Surgery [facility]

## 2024-04-04 NOTE — Anesthesia Postprocedure Evaluation (Signed)
 Anesthesia Post Note  Patient: Rickey Jones  Procedure(s) Performed: ANOSCOPY, HIGH RESOLUTION (Rectum) ABLATION, CONDYLOMA, USING LASER (Rectum)     Patient location during evaluation: PACU Anesthesia Type: MAC Level of consciousness: awake and alert Pain management: pain level controlled Vital Signs Assessment: post-procedure vital signs reviewed and stable Respiratory status: spontaneous breathing, nonlabored ventilation, respiratory function stable and patient connected to nasal cannula oxygen Cardiovascular status: stable and blood pressure returned to baseline Postop Assessment: no apparent nausea or vomiting Anesthetic complications: no   No notable events documented.  Last Vitals:  Vitals:   04/04/24 1215 04/04/24 1221  BP: 119/73 126/78  Pulse: 71 73  Resp: 14 20  Temp: 36.4 C 36.7 C  SpO2: 98% 100%    Last Pain:  Vitals:   04/04/24 1221  TempSrc: Oral  PainSc: 0-No pain                 Theotis Flake P Audrey Thull

## 2024-04-04 NOTE — Anesthesia Procedure Notes (Signed)
 Procedure Name: MAC Date/Time: 04/04/2024 10:49 AM  Performed by: Darlena Ego, CRNAPatient Re-evaluated:Patient Re-evaluated prior to induction Oxygen Delivery Method: Simple face mask Preoxygenation: Pre-oxygenation with 100% oxygen Induction Type: IV induction Airway Equipment and Method: Oral airway

## 2024-04-04 NOTE — Op Note (Addendum)
 04/04/2024  11:36 AM  PATIENT:  Rickey Jones  48 y.o. male  Patient Care Team: Pcp, No as PCP - General  PRE-OPERATIVE DIAGNOSIS:  ANAL CONDYLOMA  POST-OPERATIVE DIAGNOSIS:  ANAL CONDYLOMA  PROCEDURE:  ANOSCOPY, HIGH RESOLUTION BIOPSY LASER ABLATION of CONDYLOMA    Surgeon(s): Joyce Nixon, MD  ASSISTANT: none   ANESTHESIA:   local and MAC  EBL: 5ml  Total I/O In: 800 [I.V.:800] Out: -   SPECIMEN:  Source of Specimen:  R anterior and L posterior anal canal  DISPOSITION OF SPECIMEN:  PATHOLOGY  COUNTS:  YES  PLAN OF CARE: Discharge to home after PACU  PATIENT DISPOSITION:  PACU - hemodynamically stable.  INDICATION: 48 y.o. HIV+ M with anal condyloma  OR FINDINGS:   DESCRIPTION: The patient was identified in the preoperative holding area and taken to the OR where they were laid prone on the operating room table in jack knife position. MAC anesthesia was smoothly induced.  The patient was then prepped and draped in the usual sterile fashion. A surgical timeout was performed indicating the correct patient, procedure, positioning and preoperative antibioitics. SCDs were noted to be in place and functioning prior to the operation.   After this was completed, a sponge was soaked in 5% acetic acid was placed over the perianal region. This was allowed to soak for 2 minutes. The sponge was removed and the perianal region was evaluated with a colposcope.  There were no external lesions.  The internal anal canal was evaluated via anoscopy with a Hill-Ferguson anoscope.  There were multiple acidic white lesions consistent with condyloma.  1 of these was taken for biopsy specimen using sharp excision.  There was another discrete lesion in the right anterior anal canal that was taken for biopsy.  After this was completed, hemostasis was achieved with electrocautery and all of the biopsy sites were closed using a 3-0 chromic suture.  Next the laser was brought onto the field.  The  edges of the operative field were draped with wet towels.  Appropriate ventilation was obtained.  All staff were protected with small particle masks and goggles safe for the laser.  The laser was then activated. All condylomatous lesions were ablated. Hemostasis was then achieved using electrocautery.  A sterile dressing was applied over this. The patient was then awakened from anesthesia and sent to the postanesthesia care unit in stable condition. All counts were correct operating room staff.   Fernande Howells, MD  Colorectal and General Surgery [facility]

## 2024-04-04 NOTE — Transfer of Care (Signed)
 Immediate Anesthesia Transfer of Care Note  Patient: Rickey Jones  Procedure(s) Performed: ANOSCOPY, HIGH RESOLUTION (Rectum) ABLATION, CONDYLOMA, USING LASER (Rectum)  Patient Location: PACU  Anesthesia Type:MAC  Level of Consciousness: awake, alert , and oriented  Airway & Oxygen Therapy: Patient Spontanous Breathing  Post-op Assessment: Report given to RN and Post -op Vital signs reviewed and stable  Post vital signs: Reviewed and stable  Last Vitals:  Vitals Value Taken Time  BP 114/68 04/04/24 1145  Temp    Pulse 77 04/04/24 1145  Resp 11 04/04/24 1145  SpO2 96 % 04/04/24 1145  Vitals shown include unfiled device data.  Last Pain:  Vitals:   04/04/24 0934  TempSrc: Oral  PainSc:          Complications: No notable events documented.

## 2024-04-04 NOTE — Discharge Instructions (Addendum)

## 2024-04-05 ENCOUNTER — Encounter (HOSPITAL_COMMUNITY): Payer: Self-pay | Admitting: General Surgery

## 2024-04-05 LAB — SURGICAL PATHOLOGY

## 2024-04-09 ENCOUNTER — Other Ambulatory Visit: Payer: Self-pay

## 2024-04-09 ENCOUNTER — Ambulatory Visit (INDEPENDENT_AMBULATORY_CARE_PROVIDER_SITE_OTHER): Payer: Self-pay | Admitting: Family

## 2024-04-09 ENCOUNTER — Encounter: Payer: Self-pay | Admitting: Family

## 2024-04-09 VITALS — BP 123/75 | HR 91 | Temp 98.4°F | Wt 191.8 lb

## 2024-04-09 DIAGNOSIS — F17200 Nicotine dependence, unspecified, uncomplicated: Secondary | ICD-10-CM | POA: Diagnosis not present

## 2024-04-09 DIAGNOSIS — Z0000 Encounter for general adult medical examination without abnormal findings: Secondary | ICD-10-CM

## 2024-04-09 DIAGNOSIS — Z79899 Other long term (current) drug therapy: Secondary | ICD-10-CM

## 2024-04-09 DIAGNOSIS — A539 Syphilis, unspecified: Secondary | ICD-10-CM

## 2024-04-09 DIAGNOSIS — B20 Human immunodeficiency virus [HIV] disease: Secondary | ICD-10-CM

## 2024-04-09 MED ORDER — BIKTARVY 50-200-25 MG PO TABS: 1.0000 | ORAL_TABLET | Freq: Every day | ORAL | 6 refills | Status: DC

## 2024-04-09 NOTE — Assessment & Plan Note (Deleted)
 Mr. Ramsburg continues to have well-controlled virus with good adherence and tolerance to Biktarvy .  Reviewed lab work and discussed plan of care and U equals U.  No problem obtaining medication from the pharmacy and covered by [facility].  Social determinants of health reviewed with no interventions indicated.  Continue current dose of Biktarvy .  Plan for follow-up in 6 months or sooner if needed with lab work 1 to 2 weeks prior to appointment.

## 2024-04-09 NOTE — Assessment & Plan Note (Signed)
 Not currently sexually active. Vaccinations reviewed and due for tetanus, Prevnar 20, and Menveo which were deferred today. Due for routine dental care which she will schedule independently. Anal cancer screening up-to-date and follows with Dr. Andy Bannister Due for colon cancer screening and will reschedule when available.

## 2024-04-09 NOTE — Progress Notes (Signed)
 Brief Narrative   Patient ID: Rickey Jones, male    DOB: [birth year], 48 y.o.   MRN: [id number]  Rickey Jones is a 49 y/o gentleman initially diagnosed with HIV around 2003 with risk factor of MSM. Initial viral load and CD4 count are unknown. History of thrush. Genotype from 04/03/19 with no medication resistance mutations. [id number] negative. Previous ART experience with Stribild, Atripla, and now Biktarvy .    Subjective:   Chief Complaint  Patient presents with   Follow-up    B20    HPI:  Rickey Jones is a 48 y.o. male with HIV disease last seen on 09/26/2023 with well-controlled virus and good adherence and tolerance to Biktarvy .  Viral load was undetectable with CD4 count of 768.  Kidney function, liver function, electrolytes within normal ranges.  Most recent lab work completed on 03/26/2024 with viral load that remains undetectable and CD4 count 845.  Kidney function, liver function, electrolytes remain normal.  Lipid profile triglycerides 170, LDL 109, and HDL 39.  10-year ASCVD risk of 5.6%.  Was referred to general surgery for condyloma which was removed last week.  Here today for routine follow-up.  Rickey Jones has been doing okay since his last office visit and continues to take Biktarvy  as prescribed with no adverse side effects or problems obtaining medication from the pharmacy.  Covered by [facility].  Recovering from recent surgical procedure and has been out of work with plans to return on Friday.  No new concerns/complaints.  Healthcare maintenance reviewed.  Housing, transportation, and access to food are stable.  Not currently sexually active.  Continues to smoke tobacco.  Due for routine dental care.  Denies fevers, chills, night sweats, headaches, changes in vision, neck pain/stiffness, nausea, diarrhea, vomiting, lesions or rashes.  Lab Results  Component Value Date   CD4TCELL 30 (L) 03/26/2024   CD4TABS 845 03/26/2024   Lab Results  Component Value Date   HIV1RNAQUANT  <20 DETECTED (A) 03/26/2024     Allergies  Allergen Reactions   Keflex [Cephalexin] Hives      Outpatient Medications Prior to Visit  Medication Sig Dispense Refill   traMADol  (ULTRAM ) 50 MG tablet Take 1-2 tablets (50-100 mg total) by mouth every 6 (six) hours as needed. 20 tablet 0   bictegravir-emtricitabine-tenofovir  AF (BIKTARVY ) 50-200-25 MG TABS tablet Take 1 tablet by mouth daily. 30 tablet 6   No facility-administered medications prior to visit.     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    Pneumonia    Shingles    Thrush of mouth and esophagus (HCC)      Past Surgical History:  Procedure Laterality Date   HIGH RESOLUTION ANOSCOPY N/A 04/04/2024   Procedure: ANOSCOPY, HIGH RESOLUTION;  Surgeon: Joyce Nixon, MD;  Location: [facility];  Service: General;  Laterality: N/A;  HRA, EXCISIONAL BIOPSY   LASER ABLATION CONDOLAMATA N/A 04/04/2024   Procedure: ABLATION, CONDYLOMA, USING LASER;  Surgeon: Joyce Nixon, MD;  Location: [facility];  Service: General;  Laterality: N/A;   NO PAST SURGERIES     right inguinal hernia repair      as a child        Review of Systems  Constitutional:  Negative for appetite change, chills, fatigue, fever and unexpected weight change.  Eyes:  Negative for visual disturbance.  Respiratory:  Negative for cough, chest tightness, shortness of breath and wheezing.   Cardiovascular:  Negative for  chest pain and leg swelling.  Gastrointestinal:  Negative for abdominal pain, constipation, diarrhea, nausea and vomiting.  Genitourinary:  Negative for dysuria, flank pain, frequency, genital sores, hematuria and urgency.  Skin:  Negative for rash.  Allergic/Immunologic: Negative for immunocompromised state.  Neurological:  Negative for dizziness and headaches.     Objective:   BP 123/75   Pulse 91   Temp 98.4 F (36.9 C) (Oral)   Wt 191 lb 12.8 oz (87 kg)   SpO2 96%    BMI 27.52 kg/m  Nursing note and vital signs reviewed.  Physical Exam Constitutional:      General: He is not in acute distress.    Appearance: He is well-developed.   Eyes:     Conjunctiva/sclera: Conjunctivae normal.    Cardiovascular:     Rate and Rhythm: Normal rate and regular rhythm.     Heart sounds: Normal heart sounds. No murmur heard.    No friction rub. No gallop.  Pulmonary:     Effort: Pulmonary effort is normal. No respiratory distress.     Breath sounds: Normal breath sounds. No wheezing or rales.  Chest:     Chest wall: No tenderness.  Abdominal:     General: Bowel sounds are normal.     Palpations: Abdomen is soft.     Tenderness: There is no abdominal tenderness.   Musculoskeletal:     Cervical back: Neck supple.  Lymphadenopathy:     Cervical: No cervical adenopathy.   Skin:    General: Skin is warm and dry.     Findings: No rash.   Neurological:     Mental Status: He is alert and oriented to person, place, and time.   Psychiatric:        Behavior: Behavior normal.        Thought Content: Thought content normal.        Judgment: Judgment normal.          04/04/2023   10:33 AM 03/08/2022   10:09 AM 09/21/2021    9:34 AM 03/17/2021    9:43 AM 06/13/2020   10:07 AM  Depression screen PHQ 2/9  Decreased Interest 0 0 3 0 0  Down, Depressed, Hopeless 0 1 3 0 0  PHQ - 2 Score 0 1 6 0 0  Altered sleeping   3    Tired, decreased energy   3    Change in appetite   3    Feeling bad or failure about yourself    3    Trouble concentrating   3    Moving slowly or fidgety/restless   0    Suicidal thoughts   0    PHQ-9 Score   21          04/09/2024   10:38 AM  GAD 7 : Generalized Anxiety Score  Nervous, Anxious, on Edge 0  Control/stop worrying 0  Worry too much - different things 0  Trouble relaxing 0  Restless 0  Easily annoyed or irritable 0  Afraid - awful might happen 0  Total GAD 7 Score 0     The 10-year ASCVD risk score (Arnett  DK, et al., 2019) is: 5.6%   Values used to calculate the score:     Age: 74 years     Clincally relevant sex: Male     Is Non-Hispanic African American: No     Diabetic: No     Tobacco smoker: Yes     Systolic Blood Pressure:  123 mmHg     Is BP treated: No     HDL Cholesterol: 39 mg/dL     Total Cholesterol: 177 mg/dL      Assessment & Plan:    Patient Active Problem List   Diagnosis Date Noted   Syphilis 09/26/2023   Screening for STDs (sexually transmitted diseases) 08/02/2019   Facial rash 05/03/2019   Healthcare maintenance 05/03/2019   Tongue coating 05/03/2019   AIDS (acquired immune deficiency syndrome) (HCC) 04/03/2019   RASH AND OTHER NONSPECIFIC SKIN ERUPTION 09/26/2007   PENILE LESION 08/28/2007   COUGH 08/28/2007   TOBACCO USER 01/24/2007   HUMAN PAPILLOMAVIRUS 12/26/2006   HEPATITIS B, HX OF 12/26/2006   HIV DISEASE 11/25/2004     Problem List Items Addressed This Visit       Other   TOBACCO USER   Rickey Jones continues to smoke tobacco daily. Counseled on the dangers of tobacco not ready to quit at this time.  Reviewed strategies to maximize success, including removing cigarettes and smoking materials from environment, stress management, substitution of other forms of reinforcement, support of family/friends, and written materials.       HIV DISEASE - Primary   Rickey Jones continues to have well-controlled virus with good adherence and tolerance to Biktarvy .  Reviewed lab work and discussed plan of care and U equals U.  No problem obtaining medication from the pharmacy and covered by [facility].  Social determinants of health reviewed with no interventions indicated.  Continue current dose of Biktarvy .  Plan for follow-up in 6 months or sooner if needed with lab work 1 to 2 weeks prior to appointment.      Relevant Medications   bictegravir-emtricitabine-tenofovir  AF (BIKTARVY ) 50-200-25 MG TABS tablet   Healthcare maintenance   Not currently sexually  active. Vaccinations reviewed and due for tetanus, Prevnar 20, and Menveo which were deferred today. Due for routine dental care which she will schedule independently. Anal cancer screening up-to-date and follows with Dr. Andy Bannister Due for colon cancer screening and will reschedule when available.        I am having Ocie Tino maintain his traMADol  and Biktarvy .   Meds ordered this encounter  Medications   bictegravir-emtricitabine-tenofovir  AF (BIKTARVY ) 50-200-25 MG TABS tablet    Sig: Take 1 tablet by mouth daily.    Dispense:  30 tablet    Refill:  6    Supervising Provider:   Liane Redman [[id number]]     Follow-up: Return in about 6 months (around 10/09/2024). or sooner if needed.    Marlan Silva, MSN, FNP-C Nurse Practitioner [facility] Main number: [phone]

## 2024-04-09 NOTE — Patient Instructions (Addendum)
 Nice to see you.  Continue to take your medication daily as prescribed.  Refills have been sent to the pharmacy.  Plan for follow up in 6 months or sooner if needed with lab work 1-2 weeks prior to appointment.   Have a great day and stay safe!  Smoking Cessation: QuitlineNC 1-800-QUIT-NOW 616-761-2084); Espaol: 1-855-Djelo-Ya (1-817-494-2916) http://carroll-castaneda.info/

## 2024-04-09 NOTE — Assessment & Plan Note (Signed)
 Rickey Jones continues to have well-controlled virus with good adherence and tolerance to Biktarvy .  Reviewed lab work and discussed plan of care and U equals U.  No problem obtaining medication from the pharmacy and covered by [facility].  Social determinants of health reviewed with no interventions indicated.  Continue current dose of Biktarvy .  Plan for follow-up in 6 months or sooner if needed with lab work 1 to 2 weeks prior to appointment.

## 2024-04-09 NOTE — Assessment & Plan Note (Signed)
 Rickey Jones continues to smoke tobacco daily. Counseled on the dangers of tobacco not ready to quit at this time.  Reviewed strategies to maximize success, including removing cigarettes and smoking materials from environment, stress management, substitution of other forms of reinforcement, support of family/friends, and written materials.

## 2024-04-25 ENCOUNTER — Other Ambulatory Visit: Payer: Self-pay

## 2024-04-25 ENCOUNTER — Ambulatory Visit

## 2024-09-10 ENCOUNTER — Other Ambulatory Visit

## 2024-09-10 ENCOUNTER — Other Ambulatory Visit: Payer: Self-pay

## 2024-09-10 DIAGNOSIS — Z79899 Other long term (current) drug therapy: Secondary | ICD-10-CM

## 2024-09-10 DIAGNOSIS — B20 Human immunodeficiency virus [HIV] disease: Secondary | ICD-10-CM

## 2024-09-10 DIAGNOSIS — A539 Syphilis, unspecified: Secondary | ICD-10-CM

## 2024-09-12 LAB — LIPID PANEL
Cholesterol: 181 mg/dL (ref <200)
HDL: 38 mg/dL — ABNORMAL LOW (ref >=40)
LDL Cholesterol (Calc): 108 mg/dL — ABNORMAL HIGH
Non-HDL Cholesterol (Calc): 143 mg/dL — ABNORMAL HIGH (ref <130)
Total CHOL/HDL Ratio: 4.8 (calc) (ref <5.0)
Triglycerides: 228 mg/dL — ABNORMAL HIGH (ref <150)

## 2024-09-12 LAB — COMPREHENSIVE METABOLIC PANEL WITH GFR
AG Ratio: 2.7 (calc) — ABNORMAL HIGH (ref 1.0–2.5)
ALT: 29 U/L (ref 9–46)
AST: 16 U/L (ref 10–40)
Albumin: 4.8 g/dL (ref 3.6–5.1)
Alkaline phosphatase (APISO): 57 U/L (ref 36–130)
BUN: 22 mg/dL (ref 7–25)
CO2: 26 mmol/L (ref 20–32)
Calcium: 9.8 mg/dL (ref 8.6–10.3)
Chloride: 103 mmol/L (ref 98–110)
Creat: 1.07 mg/dL (ref 0.60–1.29)
Globulin: 1.8 g/dL — ABNORMAL LOW (ref 1.9–3.7)
Glucose, Bld: 100 mg/dL — ABNORMAL HIGH (ref 65–99)
Potassium: 4.6 mmol/L (ref 3.5–5.3)
Sodium: 138 mmol/L (ref 135–146)
Total Bilirubin: 0.3 mg/dL (ref 0.2–1.2)
Total Protein: 6.6 g/dL (ref 6.1–8.1)
eGFR: 87 mL/min/1.73m2 (ref >=60)

## 2024-09-12 LAB — RPR: RPR Ser Ql: REACTIVE — AB

## 2024-09-12 LAB — HIV-1 RNA QUANT-NO REFLEX-BLD
HIV 1 RNA Quant: <20 {copies}/mL — AB
HIV-1 RNA Quant, Log: <1.3 {Log_copies}/mL — AB

## 2024-09-12 LAB — T-HELPER CELL (CD4) - (RCID CLINIC ONLY)
CD4 % Helper T Cell: 30 % — ABNORMAL LOW (ref 33–65)
CD4 T Cell Abs: 823 /uL (ref 400–1790)

## 2024-09-12 LAB — T PALLIDUM AB: T Pallidum Abs: POSITIVE — AB

## 2024-09-12 LAB — RPR TITER: RPR Titer: 1:1 {titer} — ABNORMAL HIGH

## 2024-09-24 ENCOUNTER — Other Ambulatory Visit: Payer: Self-pay

## 2024-09-24 ENCOUNTER — Ambulatory Visit: Payer: Self-pay | Admitting: Family

## 2024-09-24 ENCOUNTER — Encounter: Payer: Self-pay | Admitting: Family

## 2024-09-24 VITALS — BP 126/85 | HR 77 | Temp 98.3°F | Resp 16 | Wt 201.0 lb

## 2024-09-24 DIAGNOSIS — F17210 Nicotine dependence, cigarettes, uncomplicated: Secondary | ICD-10-CM

## 2024-09-24 DIAGNOSIS — B20 Human immunodeficiency virus [HIV] disease: Secondary | ICD-10-CM

## 2024-09-24 DIAGNOSIS — F17200 Nicotine dependence, unspecified, uncomplicated: Secondary | ICD-10-CM

## 2024-09-24 DIAGNOSIS — Z79899 Other long term (current) drug therapy: Secondary | ICD-10-CM | POA: Diagnosis not present

## 2024-09-24 DIAGNOSIS — Z9189 Other specified personal risk factors, not elsewhere classified: Secondary | ICD-10-CM | POA: Insufficient documentation

## 2024-09-24 DIAGNOSIS — Z0000 Encounter for general adult medical examination without abnormal findings: Secondary | ICD-10-CM

## 2024-09-24 DIAGNOSIS — A539 Syphilis, unspecified: Secondary | ICD-10-CM

## 2024-09-24 MED ORDER — BIKTARVY 50-200-25 MG PO TABS: 1.0000 | ORAL_TABLET | Freq: Every day | ORAL | 6 refills | Status: AC

## 2024-09-24 NOTE — Assessment & Plan Note (Signed)
 Not currently sexually active.  Vaccinations reviewed and declined following counseling.  Due for colon cancer screening with colonoscopy. Encouraged to complete.

## 2024-09-24 NOTE — Assessment & Plan Note (Signed)
 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 I do not believe this represents new infection and would not recommend treatment at this time. Will continue to monitor.

## 2024-09-24 NOTE — Progress Notes (Signed)
 Brief Narrative   Patient ID: Rickey Jones, male    DOB: February 01, 1976, 48 y.o.   MRN: [id number]  Mr. Melroy is a 48 y/o gentleman initially diagnosed with HIV around 2003 with risk factor of MSM. Initial viral load and CD4 count are unknown. History of thrush. Genotype from 04/03/19 with no medication resistance mutations. [id number] negative. Previous ART experience with Stribild, Atripla, and now Biktarvy .   Subjective:   Chief Complaint  Patient presents with   Follow-up    B20     HPI:  Rickey Jones is a 48 y.o. male with HIV disease last seen on 04/09/2024 with well-controlled virus and good adherence and tolerance to Biktarvy .  Viral load was undetectable with CD4 count 845.  Kidney function, liver function, electrolytes within normal ranges.  Lipid profile with triglycerides 170, LDL 109, and HDL 39.  Most recent lab work completed on 09/10/2024 with viral load that remains undetectable and CD4 count 823.  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  Kidney function, liver function, electrolytes within normal ranges.  Lipid profile triglycerides 228, LDL 108, and HDL 38.  Here today for routine follow-up.  Mr. Hengel has been doing well since last office visit and continues to take medication as prescribed with no adverse side effects or problems obtaining medication from the pharmacy. Covered by [employer]. Working full time with stable housing, transportation and access to food. No new concerns/complaints. Continues to smoke about 1 pack of cigarettes per day. Not currently sexually active. Healthcare maintenance reviewed.   Denies fevers, chills, night sweats, headaches, changes in vision, neck pain/stiffness, nausea, diarrhea, vomiting, lesions or rashes.  Lab Results  Component Value Date   CD4TCELL 30 (L) 09/10/2024   CD4TABS 823 09/10/2024   Lab Results  Component Value Date   HIV1RNAQUANT <20 DETECTED (A) 09/10/2024     Allergies  Allergen  Reactions   Keflex [Cephalexin] Hives      Outpatient Medications Prior to Visit  Medication Sig Dispense Refill   bictegravir-emtricitabine-tenofovir  AF (BIKTARVY ) 50-200-25 MG TABS tablet Take 1 tablet by mouth daily. 30 tablet 6   traMADol  (ULTRAM ) 50 MG tablet Take 1-2 tablets (50-100 mg total) by mouth every 6 (six) hours as needed. (Patient not taking: Reported on 09/24/2024) 20 tablet 0   No facility-administered medications prior to visit.     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    Pneumonia    Shingles    Thrush of mouth and esophagus (HCC)      Past Surgical History:  Procedure Laterality Date   HIGH RESOLUTION ANOSCOPY N/A 04/04/2024   Procedure: ANOSCOPY, HIGH RESOLUTION;  Surgeon: Debby Hila, MD;  Location: [facility];  Service: General;  Laterality: N/A;  HRA, EXCISIONAL BIOPSY   LASER ABLATION CONDOLAMATA N/A 04/04/2024   Procedure: ABLATION, CONDYLOMA, USING LASER;  Surgeon: Debby Hila, MD;  Location: [facility];  Service: General;  Laterality: N/A;   NO PAST SURGERIES     right inguinal hernia repair      as a child        Review of Systems  Constitutional:  Negative for appetite change, chills, fatigue, fever and unexpected weight change.  Eyes:  Negative for visual disturbance.  Respiratory:  Negative for cough, chest tightness, shortness of breath and wheezing.   Cardiovascular:  Negative for chest pain and leg swelling.  Gastrointestinal:  Negative for  abdominal pain, constipation, diarrhea, nausea and vomiting.  Genitourinary:  Negative for dysuria, flank pain, frequency, genital sores, hematuria and urgency.  Skin:  Negative for rash.  Allergic/Immunologic: Negative for immunocompromised state.  Neurological:  Negative for dizziness and headaches.     Objective:   BP 126/85   Pulse 77   Temp 98.3 F (36.8 C) (Oral)   Resp 16   Wt 201 lb (91.2 kg)   SpO2 97%   BMI  28.84 kg/m  Nursing note and vital signs reviewed.  Physical Exam Constitutional:      General: He is not in acute distress.    Appearance: He is well-developed.  Eyes:     Conjunctiva/sclera: Conjunctivae normal.  Cardiovascular:     Rate and Rhythm: Normal rate and regular rhythm.     Heart sounds: Normal heart sounds. No murmur heard.    No friction rub. No gallop.  Pulmonary:     Effort: Pulmonary effort is normal. No respiratory distress.     Breath sounds: Normal breath sounds. No wheezing or rales.  Chest:     Chest wall: No tenderness.  Abdominal:     General: Bowel sounds are normal.     Palpations: Abdomen is soft.     Tenderness: There is no abdominal tenderness.  Musculoskeletal:     Cervical back: Neck supple.  Lymphadenopathy:     Cervical: No cervical adenopathy.  Skin:    General: Skin is warm and dry.     Findings: No rash.  Neurological:     Mental Status: He is alert and oriented to person, place, and time.          04/04/2023   10:33 AM 03/08/2022   10:09 AM 09/21/2021    9:34 AM 03/17/2021    9:43 AM 06/13/2020   10:07 AM  Depression screen PHQ 2/9  Decreased Interest 0 0 3 0 0  Down, Depressed, Hopeless 0 1 3 0 0  PHQ - 2 Score 0 1 6 0 0  Altered sleeping   3    Tired, decreased energy   3    Change in appetite   3    Feeling bad or failure about yourself    3    Trouble concentrating   3    Moving slowly or fidgety/restless   0    Suicidal thoughts   0    PHQ-9 Score   21        Data saved with a previous flowsheet row definition        04/09/2024   10:38 AM  GAD 7 : Generalized Anxiety Score  Nervous, Anxious, on Edge 0  Control/stop worrying 0  Worry too much - different things 0  Trouble relaxing 0  Restless 0  Easily annoyed or irritable 0  Afraid - awful might happen 0  Total GAD 7 Score 0     The 10-year ASCVD risk score (Arnett DK, et al., 2019) is: 6.7%   Values used to calculate the score:     Age: 73 years      Clincally relevant sex: Male     Is Non-Hispanic African American: No     Diabetic: No     Tobacco smoker: Yes     Systolic Blood Pressure: 126 mmHg     Is BP treated: No     HDL Cholesterol: 38 mg/dL     Total Cholesterol: 181 mg/dL      Assessment & Plan:  Patient Active Problem List   Diagnosis Date Noted   At risk for cardiovascular event 09/24/2024   Syphilis 09/26/2023   Screening for STDs (sexually transmitted diseases) 08/02/2019   Facial rash 05/03/2019   Healthcare maintenance 05/03/2019   Tongue coating 05/03/2019   AIDS (acquired immune deficiency syndrome) (HCC) 04/03/2019   RASH AND OTHER NONSPECIFIC SKIN ERUPTION 09/26/2007   PENILE LESION 08/28/2007   COUGH 08/28/2007   TOBACCO USER 01/24/2007   HUMAN PAPILLOMAVIRUS 12/26/2006   HEPATITIS B, HX OF 12/26/2006   HIV DISEASE 11/25/2004     Problem List Items Addressed This Visit       Other   TOBACCO USER   Mr. Mandich continues to smoke about 1 pack of cigarettes per day. Counseled on the dangers of tobacco not ready to quit at this time.  Reviewed strategies to maximize success, including removing cigarettes and smoking materials from environment, stress management, substitution of other forms of reinforcement, support of family/friends, and written materials.       HIV DISEASE   Mr. Lusk continues to have well controlled virus with good adherence and tolerance to Biktarvy .  Reviewed lab work and discussed plan of care, and U equals U. Social determinants of health reviewed and no interventions indicated. Continue current dose of Biktarvy . Plan for follow up in  6 months or sooner if needed with lab work 1-2 weeks prior to appointment. .       Relevant Medications   bictegravir-emtricitabine-tenofovir  AF (BIKTARVY ) 50-200-25 MG TABS tablet   Healthcare maintenance   Not currently sexually active.  Vaccinations reviewed and declined following counseling.  Due for colon cancer screening with  colonoscopy. Encouraged to complete.       Syphilis - Primary   RPR titer of 1:1 which is consistent with previous. I do not believe this represents new infection and would not recommend treatment at this time. Will continue to monitor.       Relevant Medications   bictegravir-emtricitabine-tenofovir  AF (BIKTARVY ) 50-200-25 MG TABS tablet   At risk for cardiovascular event   Mr. Wrightsman has an ASCVD risk score of 6.7%. Discussed REPRIEVE results and recommendations for statin medication to reduce cardiovascular disease and HIV associated inflammation. Following counseling not interested in starting medication at this time.         I have discontinued Harvin Bednarski's traMADol . I am also having him maintain his Biktarvy .   Meds ordered this encounter  Medications   bictegravir-emtricitabine-tenofovir  AF (BIKTARVY ) 50-200-25 MG TABS tablet    Sig: Take 1 tablet by mouth daily.    Dispense:  30 tablet    Refill:  6    Supervising Provider:   LUIZ CHANNEL [[id number]]     Follow-up: Return in about 6 months (around 03/25/2025). or sooner if needed.    Cathlyn July, MSN, FNP-C Nurse Practitioner [facility] [place] [facility] Main number: [phone]

## 2024-09-24 NOTE — Assessment & Plan Note (Signed)
 [name] continues to have well controlled virus with good adherence and tolerance to Biktarvy .  Reviewed lab work and discussed plan of care, and U equals U. Social determinants of health reviewed and no interventions indicated. Continue current dose of Biktarvy . Plan for follow up in  6 months or sooner if needed with lab work 1-2 weeks prior to appointment. Rickey Jones

## 2024-09-24 NOTE — Assessment & Plan Note (Signed)
 Rickey Jones has an ASCVD risk score of 6.7%. Discussed REPRIEVE results and recommendations for statin medication to reduce cardiovascular disease and HIV associated inflammation. Following counseling not interested in starting medication at this time.

## 2024-09-24 NOTE — Patient Instructions (Addendum)
 Nice to see you.  Continue to take your medication daily as prescribed.  Refills have been sent to the pharmacy.  Plan for follow up in 6 months or sooner if needed with lab work 1-2 weeks prior to appointment.   Have a great day and stay safe!

## 2024-09-24 NOTE — Assessment & Plan Note (Signed)
 Mr. Sangalang continues to smoke about 1 pack of cigarettes per day. Counseled on the dangers of tobacco not ready to quit at this time.  Reviewed strategies to maximize success, including removing cigarettes and smoking materials from environment, stress management, substitution of other forms of reinforcement, support of family/friends, and written materials.

## 2025-03-26 ENCOUNTER — Other Ambulatory Visit

## 2025-04-09 ENCOUNTER — Ambulatory Visit: Payer: Self-pay | Admitting: Family
